# Patient Record
Sex: Male | Born: 1960 | Race: White | Hispanic: No | Marital: Married | Smoking: Never smoker
Health system: Southern US, Community
[De-identification: ages and names within clinical notes are randomized; demographics above are authoritative.]

## PROBLEM LIST (undated history)

## (undated) DIAGNOSIS — E785 Hyperlipidemia, unspecified: Secondary | ICD-10-CM

## (undated) DIAGNOSIS — N182 Chronic kidney disease, stage 2 (mild): Secondary | ICD-10-CM

## (undated) DIAGNOSIS — I10 Essential (primary) hypertension: Secondary | ICD-10-CM

## (undated) HISTORY — DX: Chronic kidney disease, stage 2 (mild): N18.2

## (undated) HISTORY — DX: Essential (primary) hypertension: I10

## (undated) HISTORY — DX: Hyperlipidemia, unspecified: E78.5

---

## 2000-02-07 ENCOUNTER — Encounter: Admission: RE | Admit: 2000-02-07 | Discharge: 2000-02-07 | Payer: Self-pay | Admitting: Family Medicine

## 2000-02-07 ENCOUNTER — Encounter: Payer: Self-pay | Admitting: Family Medicine

## 2010-04-03 ENCOUNTER — Ambulatory Visit: Payer: Self-pay | Admitting: Diagnostic Radiology

## 2010-04-03 ENCOUNTER — Ambulatory Visit (HOSPITAL_BASED_OUTPATIENT_CLINIC_OR_DEPARTMENT_OTHER): Admission: RE | Admit: 2010-04-03 | Discharge: 2010-04-03 | Payer: Self-pay | Admitting: Family Medicine

## 2011-01-01 ENCOUNTER — Emergency Department (HOSPITAL_BASED_OUTPATIENT_CLINIC_OR_DEPARTMENT_OTHER)
Admission: EM | Admit: 2011-01-01 | Discharge: 2011-01-01 | Payer: Self-pay | Source: Home / Self Care | Admitting: Emergency Medicine

## 2014-01-02 ENCOUNTER — Other Ambulatory Visit (HOSPITAL_BASED_OUTPATIENT_CLINIC_OR_DEPARTMENT_OTHER): Payer: Self-pay | Admitting: Family Medicine

## 2014-01-02 DIAGNOSIS — R1013 Epigastric pain: Secondary | ICD-10-CM

## 2014-01-03 ENCOUNTER — Ambulatory Visit (HOSPITAL_BASED_OUTPATIENT_CLINIC_OR_DEPARTMENT_OTHER)
Admission: RE | Admit: 2014-01-03 | Discharge: 2014-01-03 | Disposition: A | Discharge status: Home / Self Care | Payer: 59 | Source: Ambulatory Visit | Attending: Family Medicine | Admitting: Family Medicine

## 2014-01-03 DIAGNOSIS — K829 Disease of gallbladder, unspecified: Secondary | ICD-10-CM | POA: Insufficient documentation

## 2014-01-03 DIAGNOSIS — R1013 Epigastric pain: Secondary | ICD-10-CM

## 2016-06-27 ENCOUNTER — Other Ambulatory Visit (HOSPITAL_BASED_OUTPATIENT_CLINIC_OR_DEPARTMENT_OTHER): Payer: Self-pay | Admitting: Physician Assistant

## 2016-06-27 DIAGNOSIS — M545 Low back pain: Secondary | ICD-10-CM

## 2016-06-28 ENCOUNTER — Ambulatory Visit (HOSPITAL_BASED_OUTPATIENT_CLINIC_OR_DEPARTMENT_OTHER)
Admission: RE | Admit: 2016-06-28 | Discharge: 2016-06-28 | Disposition: A | Discharge status: Home / Self Care | Payer: 59 | Source: Ambulatory Visit | Attending: Physician Assistant | Admitting: Physician Assistant

## 2016-06-28 DIAGNOSIS — M545 Low back pain: Secondary | ICD-10-CM

## 2016-06-28 DIAGNOSIS — M5136 Other intervertebral disc degeneration, lumbar region: Secondary | ICD-10-CM | POA: Insufficient documentation

## 2016-06-28 DIAGNOSIS — M5126 Other intervertebral disc displacement, lumbar region: Secondary | ICD-10-CM | POA: Insufficient documentation

## 2016-06-28 DIAGNOSIS — M4806 Spinal stenosis, lumbar region: Secondary | ICD-10-CM | POA: Diagnosis not present

## 2017-05-20 DIAGNOSIS — Z13220 Encounter for screening for lipoid disorders: Secondary | ICD-10-CM | POA: Diagnosis not present

## 2017-05-20 DIAGNOSIS — Z131 Encounter for screening for diabetes mellitus: Secondary | ICD-10-CM | POA: Diagnosis not present

## 2017-05-20 DIAGNOSIS — L570 Actinic keratosis: Secondary | ICD-10-CM | POA: Diagnosis not present

## 2017-05-20 DIAGNOSIS — Z0000 Encounter for general adult medical examination without abnormal findings: Secondary | ICD-10-CM | POA: Diagnosis not present

## 2017-08-07 DIAGNOSIS — Z1211 Encounter for screening for malignant neoplasm of colon: Secondary | ICD-10-CM | POA: Diagnosis not present

## 2018-05-24 DIAGNOSIS — Z23 Encounter for immunization: Secondary | ICD-10-CM | POA: Diagnosis not present

## 2018-05-24 DIAGNOSIS — Z0000 Encounter for general adult medical examination without abnormal findings: Secondary | ICD-10-CM | POA: Diagnosis not present

## 2018-05-24 DIAGNOSIS — Z13220 Encounter for screening for lipoid disorders: Secondary | ICD-10-CM | POA: Diagnosis not present

## 2018-09-28 DIAGNOSIS — H5712 Ocular pain, left eye: Secondary | ICD-10-CM | POA: Diagnosis not present

## 2018-09-28 DIAGNOSIS — H5789 Other specified disorders of eye and adnexa: Secondary | ICD-10-CM | POA: Diagnosis not present

## 2018-09-29 DIAGNOSIS — H15102 Unspecified episcleritis, left eye: Secondary | ICD-10-CM | POA: Diagnosis not present

## 2018-10-06 DIAGNOSIS — H15102 Unspecified episcleritis, left eye: Secondary | ICD-10-CM | POA: Diagnosis not present

## 2020-12-19 ENCOUNTER — Telehealth (HOSPITAL_COMMUNITY): Payer: Self-pay | Admitting: Nurse Practitioner

## 2020-12-21 NOTE — Telephone Encounter (Signed)
No note to record.  Closing note.

## 2021-09-04 ENCOUNTER — Inpatient Hospital Stay (HOSPITAL_BASED_OUTPATIENT_CLINIC_OR_DEPARTMENT_OTHER)
Admission: EM | Admit: 2021-09-04 | Discharge: 2021-09-06 | DRG: 247 | Disposition: A | Discharge status: Home / Self Care | Payer: 59 | Attending: Cardiovascular Disease | Admitting: Cardiovascular Disease

## 2021-09-04 ENCOUNTER — Other Ambulatory Visit: Payer: Self-pay

## 2021-09-04 ENCOUNTER — Encounter (HOSPITAL_BASED_OUTPATIENT_CLINIC_OR_DEPARTMENT_OTHER): Payer: Self-pay

## 2021-09-04 ENCOUNTER — Emergency Department (HOSPITAL_BASED_OUTPATIENT_CLINIC_OR_DEPARTMENT_OTHER): Payer: 59

## 2021-09-04 ENCOUNTER — Encounter (HOSPITAL_COMMUNITY)
Admission: EM | Disposition: A | Discharge status: Home / Self Care | Payer: Self-pay | Source: Home / Self Care | Attending: Cardiovascular Disease

## 2021-09-04 ENCOUNTER — Inpatient Hospital Stay (HOSPITAL_COMMUNITY): Payer: 59

## 2021-09-04 DIAGNOSIS — I214 Non-ST elevation (NSTEMI) myocardial infarction: Secondary | ICD-10-CM

## 2021-09-04 DIAGNOSIS — Z955 Presence of coronary angioplasty implant and graft: Secondary | ICD-10-CM

## 2021-09-04 DIAGNOSIS — I2121 ST elevation (STEMI) myocardial infarction involving left circumflex coronary artery: Secondary | ICD-10-CM | POA: Diagnosis present

## 2021-09-04 DIAGNOSIS — I213 ST elevation (STEMI) myocardial infarction of unspecified site: Secondary | ICD-10-CM

## 2021-09-04 DIAGNOSIS — I2510 Atherosclerotic heart disease of native coronary artery without angina pectoris: Secondary | ICD-10-CM | POA: Diagnosis present

## 2021-09-04 DIAGNOSIS — Z20822 Contact with and (suspected) exposure to covid-19: Secondary | ICD-10-CM | POA: Diagnosis present

## 2021-09-04 DIAGNOSIS — E785 Hyperlipidemia, unspecified: Secondary | ICD-10-CM | POA: Diagnosis present

## 2021-09-04 HISTORY — DX: Atherosclerotic heart disease of native coronary artery without angina pectoris: I25.10

## 2021-09-04 HISTORY — PX: CORONARY/GRAFT ACUTE MI REVASCULARIZATION: CATH118305

## 2021-09-04 HISTORY — PX: LEFT HEART CATH AND CORONARY ANGIOGRAPHY: CATH118249

## 2021-09-04 LAB — TROPONIN I (HIGH SENSITIVITY)
Troponin I (High Sensitivity): 79 ng/L — ABNORMAL HIGH (ref <18)
Troponin I (High Sensitivity): 75 ng/L — ABNORMAL HIGH (ref <18)

## 2021-09-04 LAB — CBC
HCT: 47.2 % (ref 39.0–52.0)
Hemoglobin: 16 g/dL (ref 13.0–17.0)
MCH: 30.1 pg (ref 26.0–34.0)
MCHC: 33.9 g/dL (ref 30.0–36.0)
MCV: 88.7 fL (ref 80.0–100.0)
Platelets: 277 10*3/uL (ref 150–400)
RBC: 5.32 MIL/uL (ref 4.22–5.81)
RDW: 11.7 % (ref 11.5–15.5)
WBC: 7.3 10*3/uL (ref 4.0–10.5)
nRBC: 0 % (ref 0.0–0.2)

## 2021-09-04 LAB — RESP PANEL BY RT-PCR (FLU A&B, COVID) ARPGX2
Influenza A by PCR: NEGATIVE
Influenza B by PCR: NEGATIVE
SARS Coronavirus 2 by RT PCR: NEGATIVE

## 2021-09-04 LAB — BASIC METABOLIC PANEL
Anion gap: 9 (ref 5–15)
BUN: 25 mg/dL — ABNORMAL HIGH (ref 6–20)
CO2: 25 mmol/L (ref 22–32)
Calcium: 9.7 mg/dL (ref 8.9–10.3)
Chloride: 104 mmol/L (ref 98–111)
Creatinine, Ser: 1.17 mg/dL (ref 0.61–1.24)
GFR, Estimated: >60 mL/min (ref >60)
Glucose, Bld: 98 mg/dL (ref 70–99)
Potassium: 3.8 mmol/L (ref 3.5–5.1)
Sodium: 138 mmol/L (ref 135–145)

## 2021-09-04 LAB — POCT ACTIVATED CLOTTING TIME
Activated Clotting Time: 219 seconds
Activated Clotting Time: 289 seconds

## 2021-09-04 LAB — ECHOCARDIOGRAM COMPLETE
Area-P 1/2: 2.99 cm2
Height: 74 in
S' Lateral: 3.3 cm
Weight: 3558.4 oz

## 2021-09-04 LAB — MRSA NEXT GEN BY PCR, NASAL: MRSA by PCR Next Gen: NOT DETECTED

## 2021-09-04 SURGERY — CORONARY/GRAFT ACUTE MI REVASCULARIZATION
Anesthesia: LOCAL

## 2021-09-04 MED ORDER — CHLORHEXIDINE GLUCONATE CLOTH 2 % EX PADS
6.0000 | MEDICATED_PAD | Freq: Every day | CUTANEOUS | Status: DC
  Administered 2021-09-04: 6 via TOPICAL

## 2021-09-04 MED ORDER — NITROGLYCERIN 1 MG/10 ML FOR IR/CATH LAB
INTRA_ARTERIAL | Status: AC
  Filled 2021-09-04: qty 10

## 2021-09-04 MED ORDER — TICAGRELOR 90 MG PO TABS
ORAL_TABLET | ORAL | Status: AC
  Filled 2021-09-04: qty 2

## 2021-09-04 MED ORDER — NITROGLYCERIN 0.4 MG SL SUBL
0.4000 mg | SUBLINGUAL_TABLET | SUBLINGUAL | Status: DC | PRN
  Administered 2021-09-04: 0.4 mg via SUBLINGUAL
  Filled 2021-09-04: qty 1

## 2021-09-04 MED ORDER — HEPARIN SODIUM (PORCINE) 1000 UNIT/ML IJ SOLN
INTRAMUSCULAR | Status: DC | PRN
  Administered 2021-09-04 (×2): 5000 [IU] via INTRAVENOUS

## 2021-09-04 MED ORDER — HYDRALAZINE HCL 20 MG/ML IJ SOLN
10.0000 mg | INTRAMUSCULAR | Status: AC | PRN
Start: 2021-09-04 — End: 2021-09-04

## 2021-09-04 MED ORDER — NITROGLYCERIN 0.4 MG SL SUBL: 0.4000 mg | SUBLINGUAL_TABLET | SUBLINGUAL | Status: DC | PRN

## 2021-09-04 MED ORDER — ASPIRIN EC 81 MG PO TBEC
81.0000 mg | DELAYED_RELEASE_TABLET | Freq: Every day | ORAL | Status: DC
  Administered 2021-09-05 – 2021-09-06 (×2): 81 mg via ORAL
  Filled 2021-09-04 (×2): qty 1

## 2021-09-04 MED ORDER — ONDANSETRON HCL 4 MG/2ML IJ SOLN: 4.0000 mg | Freq: Four times a day (QID) | INTRAMUSCULAR | Status: DC | PRN

## 2021-09-04 MED ORDER — HEPARIN (PORCINE) IN NACL 1000-0.9 UT/500ML-% IV SOLN
INTRAVENOUS | Status: AC
  Filled 2021-09-04: qty 500

## 2021-09-04 MED ORDER — ACETAMINOPHEN 325 MG PO TABS: 650.0000 mg | ORAL_TABLET | ORAL | Status: DC | PRN

## 2021-09-04 MED ORDER — ASPIRIN 81 MG PO CHEW
324.0000 mg | CHEWABLE_TABLET | Freq: Once | ORAL | Status: AC
  Administered 2021-09-04: 324 mg via ORAL
  Filled 2021-09-04: qty 4

## 2021-09-04 MED ORDER — TICAGRELOR 90 MG PO TABS
ORAL_TABLET | ORAL | Status: DC | PRN
  Administered 2021-09-04: 180 mg via ORAL

## 2021-09-04 MED ORDER — FENTANYL CITRATE (PF) 100 MCG/2ML IJ SOLN
INTRAMUSCULAR | Status: AC
  Filled 2021-09-04: qty 2

## 2021-09-04 MED ORDER — SODIUM CHLORIDE 0.9% FLUSH
3.0000 mL | Freq: Two times a day (BID) | INTRAVENOUS | Status: DC
  Administered 2021-09-04 – 2021-09-06 (×4): 3 mL via INTRAVENOUS

## 2021-09-04 MED ORDER — FENTANYL CITRATE (PF) 100 MCG/2ML IJ SOLN
INTRAMUSCULAR | Status: DC | PRN
  Administered 2021-09-04: 25 ug via INTRAVENOUS
  Administered 2021-09-04: 50 ug via INTRAVENOUS

## 2021-09-04 MED ORDER — VERAPAMIL HCL 2.5 MG/ML IV SOLN
INTRAVENOUS | Status: DC | PRN
  Administered 2021-09-04: 10 mL via INTRA_ARTERIAL

## 2021-09-04 MED ORDER — TICAGRELOR 90 MG PO TABS
90.0000 mg | ORAL_TABLET | Freq: Two times a day (BID) | ORAL | Status: DC
  Administered 2021-09-04 – 2021-09-06 (×4): 90 mg via ORAL
  Filled 2021-09-04 (×4): qty 1

## 2021-09-04 MED ORDER — HEPARIN SODIUM (PORCINE) 1000 UNIT/ML IJ SOLN
INTRAMUSCULAR | Status: AC
  Filled 2021-09-04: qty 1

## 2021-09-04 MED ORDER — SODIUM CHLORIDE 0.9 % WEIGHT BASED INFUSION
1.0000 mL/kg/h | INTRAVENOUS | Status: AC
  Administered 2021-09-04: 1 mL/kg/h via INTRAVENOUS

## 2021-09-04 MED ORDER — NITROGLYCERIN 1 MG/10 ML FOR IR/CATH LAB
INTRA_ARTERIAL | Status: DC | PRN
  Administered 2021-09-04: 200 ug via INTRACORONARY
  Administered 2021-09-04: 150 ug via INTRACORONARY
  Administered 2021-09-04: 100 ug via INTRACORONARY

## 2021-09-04 MED ORDER — HEPARIN SODIUM (PORCINE) 5000 UNIT/ML IJ SOLN
4000.0000 [IU] | Freq: Once | INTRAMUSCULAR | Status: AC
  Administered 2021-09-04: 4000 [IU] via INTRAVENOUS
  Filled 2021-09-04: qty 1

## 2021-09-04 MED ORDER — HEPARIN (PORCINE) IN NACL 1000-0.9 UT/500ML-% IV SOLN
INTRAVENOUS | Status: DC | PRN
  Administered 2021-09-04 (×2): 500 mL

## 2021-09-04 MED ORDER — LIDOCAINE HCL (PF) 1 % IJ SOLN
INTRAMUSCULAR | Status: DC | PRN
  Administered 2021-09-04: 2 mL

## 2021-09-04 MED ORDER — VERAPAMIL HCL 2.5 MG/ML IV SOLN
INTRAVENOUS | Status: AC
  Filled 2021-09-04: qty 2

## 2021-09-04 MED ORDER — SODIUM CHLORIDE 0.9 % IV SOLN: 250.0000 mL | INTRAVENOUS | Status: DC | PRN

## 2021-09-04 MED ORDER — ATORVASTATIN CALCIUM 80 MG PO TABS
80.0000 mg | ORAL_TABLET | Freq: Every day | ORAL | Status: DC
  Administered 2021-09-04 – 2021-09-06 (×3): 80 mg via ORAL
  Filled 2021-09-04 (×3): qty 1

## 2021-09-04 MED ORDER — SODIUM CHLORIDE 0.9% FLUSH: 3.0000 mL | INTRAVENOUS | Status: DC | PRN

## 2021-09-04 MED ORDER — HEPARIN (PORCINE) 25000 UT/250ML-% IV SOLN: 1200.0000 [IU]/h | INTRAVENOUS | Status: DC

## 2021-09-04 MED ORDER — MIDAZOLAM HCL 2 MG/2ML IJ SOLN
INTRAMUSCULAR | Status: AC
  Filled 2021-09-04: qty 2

## 2021-09-04 MED ORDER — LIDOCAINE HCL (PF) 1 % IJ SOLN
INTRAMUSCULAR | Status: AC
Start: 2021-09-04 — End: ?
  Filled 2021-09-04: qty 30

## 2021-09-04 MED ORDER — MIDAZOLAM HCL 2 MG/2ML IJ SOLN
INTRAMUSCULAR | Status: DC | PRN
  Administered 2021-09-04 (×2): 2 mg via INTRAVENOUS

## 2021-09-04 MED ORDER — LABETALOL HCL 5 MG/ML IV SOLN: 10.0000 mg | INTRAVENOUS | Status: AC | PRN

## 2021-09-04 MED ORDER — HEPARIN BOLUS VIA INFUSION: 4000.0000 [IU] | Freq: Once | INTRAVENOUS | Status: DC

## 2021-09-04 MED ORDER — SODIUM CHLORIDE 0.9 % IV SOLN: Freq: Once | INTRAVENOUS | Status: AC

## 2021-09-04 MED ORDER — IOHEXOL 350 MG/ML SOLN
INTRAVENOUS | Status: DC | PRN
  Administered 2021-09-04: 175 mL

## 2021-09-04 SURGICAL SUPPLY — 20 items
BALLN SAPPHIRE 2.0X12 (BALLOONS) ×2
BALLN SAPPHIRE ~~LOC~~ 3.0X12 (BALLOONS) ×1 IMPLANT
BALLN SAPPHIRE ~~LOC~~ 3.75X15 (BALLOONS) ×1 IMPLANT
BALLOON SAPPHIRE 2.0X12 (BALLOONS) IMPLANT
CATH 5FR JL3.5 JR4 ANG PIG MP (CATHETERS) ×1 IMPLANT
CATH LAUNCHER 6FR EBU3.5 (CATHETERS) ×1 IMPLANT
DEVICE RAD COMP TR BAND LRG (VASCULAR PRODUCTS) ×1 IMPLANT
GLIDESHEATH SLEND SS 6F .021 (SHEATH) ×1 IMPLANT
GUIDEWIRE INQWIRE 1.5J.035X260 (WIRE) IMPLANT
INQWIRE 1.5J .035X260CM (WIRE) ×2
KIT ENCORE 26 ADVANTAGE (KITS) ×1 IMPLANT
KIT HEART LEFT (KITS) ×2 IMPLANT
PACK CARDIAC CATHETERIZATION (CUSTOM PROCEDURE TRAY) ×2 IMPLANT
STENT ONYX FRONTIER 2.5X15 (Permanent Stent) ×1 IMPLANT
STENT ONYX FRONTIER 2.75X15 (Permanent Stent) ×1 IMPLANT
STENT ONYX FRONTIER 3.5X26 (Permanent Stent) ×1 IMPLANT
SYR MEDRAD MARK 7 150ML (SYRINGE) ×2 IMPLANT
TRANSDUCER W/STOPCOCK (MISCELLANEOUS) ×2 IMPLANT
TUBING CIL FLEX 10 FLL-RA (TUBING) ×2 IMPLANT
WIRE COUGAR XT STRL 190CM (WIRE) ×1 IMPLANT

## 2021-09-04 NOTE — Progress Notes (Signed)
ANTICOAGULATION CONSULT NOTE - Initial Consult  Pharmacy Consult for heparin Indication: chest pain/ACS  No Known Allergies  Patient Measurements: Height: 6\' 2"  (188 cm) Weight: 100.9 kg (222 lb 6.4 oz) IBW/kg (Calculated) : 82.2 Heparin Dosing Weight: 95kg  Vital Signs: Temp: 97.6 F (36.4 C) (09/14 1014) Temp Source: Oral (09/14 1014) BP: 136/88 (09/14 1245) Pulse Rate: 76 (09/14 1245)  Labs: Recent Labs    09/04/21 1020  HGB 16.0  HCT 47.2  PLT 277  CREATININE 1.17  TROPONINIHS 79*    Estimated Creatinine Clearance: 85.2 mL/min (by C-G formula based on SCr of 1.17 mg/dL).   Medical History: History reviewed. No pertinent past medical history.   Assessment: 60 YOM presenting with CP, elevated troponin, he is not on anticoagulation PTA  Goal of Therapy:  Heparin level 0.3-0.7 units/ml Monitor platelets by anticoagulation protocol: Yes   Plan:  Heparin 4000 units IV x 1, and gtt at 1200 units/hr F/u 6 hour heparin level F/u cards eval and recs  09/06/21, PharmD Clinical Pharmacist ED Pharmacist Phone # [phone] 09/04/2021 12:49 PM

## 2021-09-04 NOTE — ED Notes (Signed)
ED Provider at bedside. 

## 2021-09-04 NOTE — Interval H&P Note (Signed)
Cath Lab Visit (complete for each Cath Lab visit)  Clinical Evaluation Leading to the Procedure:   ACS: Yes.    Non-ACS:    Anginal Classification: CCS IV  Anti-ischemic medical therapy: No Therapy  Non-Invasive Test Results: No non-invasive testing performed  Prior CABG: No previous CABG      History and Physical Interval Note:  09/04/2021 1:31 PM  Donald Jimenez  has presented today for surgery, with the diagnosis of STEMI.  The various methods of treatment have been discussed with the patient and family. After consideration of risks, benefits and other options for treatment, the patient has consented to  Procedure(s): Coronary/Graft Acute MI Revascularization (N/A) LEFT HEART CATH AND CORONARY ANGIOGRAPHY (N/A) as a surgical intervention.  The patient's history has been reviewed, patient examined, no change in status, stable for surgery.  I have reviewed the patient's chart and labs.  Questions were answered to the patient's satisfaction.     Donald Jimenez

## 2021-09-04 NOTE — ED Triage Notes (Signed)
Pt arrives ambulatory to ED with c/o central CP states "it feels like something is stuck" denies any trouble eating or swallowing. No radiation, denies any associated symptoms.

## 2021-09-04 NOTE — Progress Notes (Signed)
  Echocardiogram 2D Echocardiogram has been performed.  Donald Jimenez 09/04/2021, 4:33 PM

## 2021-09-04 NOTE — H&P (Signed)
Cardiology Admission History and Physical:   Patient ID: Donald Jimenez MRN: [id number]; DOB: 1961-07-17   Admission date: 09/04/2021  PCP:  [facility] Providers Cardiologist:  None        Chief Complaint:  Chest pain  Patient Profile:   Donald Jimenez is a 60 y.o. male with no significant past medical history who is being seen 09/04/2021 for the evaluation of chest pain.  History of Present Illness:   Donald Jimenez is transferred from [facility] emergency department as a code STEMI.  The patient is chest pain-free on arrival.  He has had stuttering chest pain over the last 48 hours with 15 to 20-minute episodes of substernal chest pressure multiple times yesterday and again this morning.  This morning's episode prompted him to seek emergency room evaluation.  The patient has had no chest pain since arrival at the [facility] emergency room.  He has no past history of similar symptoms.  There is no family history of coronary artery disease, CABG, or MI.  The patient is a lifelong non-smoker.  He has no personal history of hypertension, diabetes, or hyperlipidemia.  Because of dynamic EKG changes and elevated troponin, he is brought emergently for cardiac catheterization and possible PCI.   History reviewed. No pertinent past medical history.  History reviewed. No pertinent surgical history.   Medications Prior to Admission: Prior to Admission medications   Not on File     Allergies:   No Known Allergies  Social History:   Social History   Socioeconomic History   Marital status: Married    Spouse name: Not on file   Number of children: Not on file   Years of education: Not on file   Highest education level: Not on file  Occupational History   Not on file  Tobacco Use   Smoking status: Never   Smokeless tobacco: Never  Vaping Use   Vaping Use: Never used  Substance and Sexual Activity   Alcohol use: Yes     Comment: few tine time s a year   Drug use: Never   Sexual activity: Not on file  Other Topics Concern   Not on file  Social History Narrative   Not on file   Social Determinants of Health   Financial Resource Strain: Not on file  Food Insecurity: Not on file  Transportation Needs: Not on file  Physical Activity: Not on file  Stress: Not on file  Social Connections: Not on file  Intimate Partner Violence: Not on file    Family History:   The patient's family history is negative for Heart attack and Heart disease.    ROS:  Please see the history of present illness.  All other ROS reviewed and negative.     Physical Exam/Data:   Vitals:   09/04/21 1211 09/04/21 1230 09/04/21 1245 09/04/21 1326  BP:   136/88   Pulse:  75 76   Resp:  13 16   Temp:      TempSrc:      SpO2:  100% 93% 99%  Weight: 100.9 kg     Height:       No intake or output data in the 24 hours ending 09/04/21 1329 Last 3 Weights 09/04/2021 09/04/2021  Weight (lbs) 222 lb 6.4 oz 210 lb  Weight (kg) 100.88 kg 95.255 kg     Body mass index is 28.55 kg/m.  General:  Well nourished, well developed, in no acute distress HEENT: normal Lymph: no adenopathy Neck: no JVD Endocrine:  No thryomegaly Vascular: No carotid bruits; FA pulses 2+ bilaterally  Cardiac:  normal S1, S2; RRR; no murmur  Lungs:  clear to auscultation bilaterally, no wheezing, rhonchi or rales  Abd: soft, nontender, no hepatomegaly  Ext: no edema Musculoskeletal:  No deformities, BUE and BLE strength normal and equal Skin: warm and dry  Neuro:  CNs 2-12 intact, no focal abnormalities noted Psych:  Normal affect    EKG:  The ECG that was done today was personally reviewed and demonstrates normal sinus rhythm, ST/T wave changes consider acute posterior infarction versus anterior ischemia.  Relevant CV Studies: Pending  Laboratory Data:  High Sensitivity Troponin:   Recent Labs  Lab 09/04/21 1020 09/04/21 1215   TROPONINIHS 79* 75*      Chemistry Recent Labs  Lab 09/04/21 1020  NA 138  K 3.8  CL 104  CO2 25  GLUCOSE 98  BUN 25*  CREATININE 1.17  CALCIUM 9.7  GFRNONAA >60  ANIONGAP 9    No results for input(s): PROT, ALBUMIN, AST, ALT, ALKPHOS, BILITOT in the last 168 hours. Hematology Recent Labs  Lab 09/04/21 1020  WBC 7.3  RBC 5.32  HGB 16.0  HCT 47.2  MCV 88.7  MCH 30.1  MCHC 33.9  RDW 11.7  PLT 277   BNPNo results for input(s): BNP, PROBNP in the last 168 hours.  DDimer No results for input(s): DDIMER in the last 168 hours.   Radiology/Studies:  DG Chest 2 View  Result Date: 09/04/2021 CLINICAL DATA:  Chest pain EXAM: CHEST - 2 VIEW COMPARISON:  None. FINDINGS: The heart size and mediastinal contours are within normal limits. Both lungs are clear. The visualized skeletal structures are unremarkable. IMPRESSION: Normal study. Electronically Signed   By: Charlett Nose M.D.   On: 09/04/2021 10:48     Assessment and Plan:   Non-ST elevation MI: Patient with dynamic EKG changes, some concern for posterior infarct.  Patient is received IV heparin and aspirin per protocol.  He is transferred emergently for cardiac catheterization and possible PCI. I have reviewed the risks, indications, and alternatives to cardiac catheterization, possible angioplasty, and stenting with the patient. Risks include but are not limited to bleeding, infection, vascular injury, stroke, myocardial infection, arrhythmia, kidney injury, radiation-related injury in the case of prolonged fluoroscopy use, emergency cardiac surgery, and death. The patient understands the risks of serious complication is 1-2 in 1000 with diagnostic cardiac cath and 1-2% or less with angioplasty/stenting.  Further plans/disposition pending the patient's cardiac catheterization results.  Anticipate initiation of high intensity statin drug, dual antiplatelet therapy, etc.  Will assess LV function.  Risk Assessment/Risk Scores:     TIMI Risk Score for Unstable Angina or Non-ST Elevation MI:   The patient's TIMI risk score is 4, which indicates a 20% risk of all cause mortality, new or recurrent myocardial infarction or need for urgent revascularization in the next 14 days.       Severity of Illness: The appropriate patient status for this patient is INPATIENT. Inpatient status is judged to be reasonable and necessary in order to provide the required intensity of service to ensure the patient's safety. The patient's presenting symptoms, physical exam findings, and initial radiographic and laboratory data in the context of their chronic comorbidities is felt to place them at high risk for further clinical deterioration. Furthermore, it is not anticipated that the patient will be  medically stable for discharge from the hospital within 2 midnights of admission.    * I certify that at the point of admission it is my clinical judgment that the patient will require inpatient hospital care spanning beyond 2 midnights from the point of admission due to high intensity of service, high risk for further deterioration and high frequency of surveillance required.*   For questions or updates, please contact [facility] Please consult [email or web address] for contact info under     Signed, Tonny Bollman, MD  09/04/2021 1:29 PM

## 2021-09-04 NOTE — ED Provider Notes (Signed)
[facility] EMERGENCY DEPARTMENT Provider Note   CSN: [id number] Arrival date & time: 09/04/21  1003     History Chief Complaint  Patient presents with   Chest Pain    Donald Jimenez is a 60 y.o. male.  Patient is a 60 year old male who presents with chest pain.  He has no prior medical history.  He said he had some pain in his central chest yesterday it lasted most of the day.  It went away through the night after he took some ibuprofen.  This morning he was at work and she was walking out of a gas station with some coffee and started having the pain again.  Describes it as a heaviness to his central chest and feels like something is in his throat.  There is no shortness of breath.  No nausea or vomiting.  No diaphoresis.  No history of heart problems in the past.  He is not on any medications.  He does not take aspirin.      History reviewed. No pertinent past medical history.  There are no problems to display for this patient.   History reviewed. No pertinent surgical history.     No family history on file.  Social History   Tobacco Use   Smoking status: Never   Smokeless tobacco: Never  Vaping Use   Vaping Use: Never used  Substance Use Topics   Alcohol use: Yes    Comment: few tine time s a year   Drug use: Never    Home Medications Prior to Admission medications   Not on File    Allergies    Patient has no known allergies.  Review of Systems   Review of Systems  Constitutional:  Negative for chills, diaphoresis, fatigue and fever.  HENT:  Negative for congestion, rhinorrhea and sneezing.   Eyes: Negative.   Respiratory:  Positive for chest tightness. Negative for cough and shortness of breath.   Cardiovascular:  Negative for chest pain and leg swelling.  Gastrointestinal:  Negative for abdominal pain, blood in stool, diarrhea, nausea and vomiting.  Genitourinary:  Negative for difficulty urinating, flank pain, frequency and hematuria.   Musculoskeletal:  Negative for arthralgias and back pain.  Skin:  Negative for rash.  Neurological:  Negative for dizziness, speech difficulty, weakness, numbness and headaches.   Physical Exam Updated Vital Signs BP (!) 163/101   Pulse 65   Temp 97.6 F (36.4 C) (Oral)   Resp 13   Ht 6\' 2"  (1.88 m)   Wt 100.9 kg   SpO2 98%   BMI 28.55 kg/m   Physical Exam Constitutional:      Appearance: He is well-developed.  HENT:     Head: Normocephalic and atraumatic.  Eyes:     Pupils: Pupils are equal, round, and reactive to light.  Cardiovascular:     Rate and Rhythm: Normal rate and regular rhythm.     Heart sounds: Normal heart sounds.  Pulmonary:     Effort: Pulmonary effort is normal. No respiratory distress.     Breath sounds: Normal breath sounds. No wheezing or rales.  Chest:     Chest wall: No tenderness.  Abdominal:     General: Bowel sounds are normal.     Palpations: Abdomen is soft.     Tenderness: There is no abdominal tenderness. There is no guarding or rebound.  Musculoskeletal:        General: Normal range of motion.     Cervical back: Normal  range of motion and neck supple.     Comments: No edema or calf tenderness  Lymphadenopathy:     Cervical: No cervical adenopathy.  Skin:    General: Skin is warm and dry.     Findings: No rash.  Neurological:     Mental Status: He is alert and oriented to person, place, and time.    ED Results / Procedures / Treatments   Labs (all labs ordered are listed, but only abnormal results are displayed) Labs Reviewed  BASIC METABOLIC PANEL - Abnormal; Notable for the following components:      Result Value   BUN 25 (*)    All other components within normal limits  TROPONIN I (HIGH SENSITIVITY) - Abnormal; Notable for the following components:   Troponin I (High Sensitivity) 79 (*)    All other components within normal limits  CBC  TROPONIN I (HIGH SENSITIVITY)    EKG EKG Interpretation  Date/Time:  Wednesday  September 04 2021 10:11:25 EDT Ventricular Rate:  67 PR Interval:  156 QRS Duration: 78 QT Interval:  358 QTC Calculation: 378 R Axis:   43 Text Interpretation: Normal sinus rhythm Nonspecific ST abnormality Abnormal ECG No old tracing to compare Confirmed by Rolan Bucco [phone]) on 09/04/2021 12:00:21 PM  Radiology DG Chest 2 View  Result Date: 09/04/2021 CLINICAL DATA:  Chest pain EXAM: CHEST - 2 VIEW COMPARISON:  None. FINDINGS: The heart size and mediastinal contours are within normal limits. Both lungs are clear. The visualized skeletal structures are unremarkable. IMPRESSION: Normal study. Electronically Signed   By: Charlett Nose M.D.   On: 09/04/2021 10:48    Procedures Procedures   Medications Ordered in ED Medications  aspirin chewable tablet 324 mg (has no administration in time range)  heparin bolus via infusion 4,000 Units (has no administration in time range)  nitroGLYCERIN (NITROSTAT) SL tablet 0.4 mg (has no administration in time range)    ED Course  I have reviewed the triage vital signs and the nursing notes.  Pertinent labs & imaging results that were available during my care of the patient were reviewed by me and considered in my medical decision making (see chart for details).    MDM Rules/Calculators/A&P                           Patient is a 60 year old male who presents with chest discomfort.  No associated symptoms.  His first EKG showed some slight ST depression anteriorly.  His troponin came back positive at 79.  A repeat EKG was performed which showed some slight increase in the ST depression.  No elevation was noted.  I spoke with Dr. Excell Seltzer with cardiology who recommends to go ahead and activate the Cath Lab given the EKG changes in conjunction with unstable angina.  He does have ongoing chest pain.  We will start nitroglycerin, aspirin and heparin.  CRITICAL CARE Performed by: Rolan Bucco Total critical care time: 60 minutes Critical care time  was exclusive of separately billable procedures and treating other patients. Critical care was necessary to treat or prevent imminent or life-threatening deterioration. Critical care was time spent personally by me on the following activities: development of treatment plan with patient and/or surrogate as well as nursing, discussions with consultants, evaluation of patient's response to treatment, examination of patient, obtaining history from patient or surrogate, ordering and performing treatments and interventions, ordering and review of laboratory studies, ordering and review of radiographic studies,  pulse oximetry and re-evaluation of patient's condition.  Final Clinical Impression(s) / ED Diagnoses Final diagnoses:  NSTEMI (non-ST elevated myocardial infarction) [facility])    Rx / DC Orders ED Discharge Orders     None        Rolan Bucco, MD 09/04/21 1240

## 2021-09-05 ENCOUNTER — Other Ambulatory Visit (HOSPITAL_COMMUNITY): Payer: Self-pay

## 2021-09-05 ENCOUNTER — Encounter (HOSPITAL_COMMUNITY): Payer: Self-pay | Admitting: Cardiovascular Disease

## 2021-09-05 DIAGNOSIS — I214 Non-ST elevation (NSTEMI) myocardial infarction: Secondary | ICD-10-CM

## 2021-09-05 LAB — BASIC METABOLIC PANEL
Anion gap: 5 (ref 5–15)
BUN: 18 mg/dL (ref 6–20)
CO2: 27 mmol/L (ref 22–32)
Calcium: 9.1 mg/dL (ref 8.9–10.3)
Chloride: 105 mmol/L (ref 98–111)
Creatinine, Ser: 1.31 mg/dL — ABNORMAL HIGH (ref 0.61–1.24)
GFR, Estimated: >60 mL/min (ref >60)
Glucose, Bld: 90 mg/dL (ref 70–99)
Potassium: 4.2 mmol/L (ref 3.5–5.1)
Sodium: 137 mmol/L (ref 135–145)

## 2021-09-05 LAB — CBC
HCT: 41.6 % (ref 39.0–52.0)
Hemoglobin: 14.2 g/dL (ref 13.0–17.0)
MCH: 30.3 pg (ref 26.0–34.0)
MCHC: 34.1 g/dL (ref 30.0–36.0)
MCV: 88.9 fL (ref 80.0–100.0)
Platelets: 236 10*3/uL (ref 150–400)
RBC: 4.68 MIL/uL (ref 4.22–5.81)
RDW: 11.7 % (ref 11.5–15.5)
WBC: 11.1 10*3/uL — ABNORMAL HIGH (ref 4.0–10.5)
nRBC: 0 % (ref 0.0–0.2)

## 2021-09-05 LAB — LIPID PANEL
Cholesterol: 191 mg/dL (ref 0–200)
HDL: 41 mg/dL (ref >40)
LDL Cholesterol: 120 mg/dL — ABNORMAL HIGH (ref 0–99)
Total CHOL/HDL Ratio: 4.7 RATIO
Triglycerides: 148 mg/dL (ref <150)
VLDL: 30 mg/dL (ref 0–40)

## 2021-09-05 LAB — HIV ANTIBODY (ROUTINE TESTING W REFLEX): HIV Screen 4th Generation wRfx: NONREACTIVE

## 2021-09-05 MED ORDER — METOPROLOL TARTRATE 12.5 MG HALF TABLET
12.5000 mg | ORAL_TABLET | Freq: Two times a day (BID) | ORAL | Status: DC
  Administered 2021-09-05 – 2021-09-06 (×3): 12.5 mg via ORAL
  Filled 2021-09-05 (×3): qty 1

## 2021-09-05 MED ORDER — ENOXAPARIN SODIUM 40 MG/0.4ML IJ SOSY
40.0000 mg | PREFILLED_SYRINGE | INTRAMUSCULAR | Status: DC
  Administered 2021-09-05 – 2021-09-06 (×2): 40 mg via SUBCUTANEOUS
  Filled 2021-09-05 (×2): qty 0.4

## 2021-09-05 NOTE — Care Management (Addendum)
09-05-21 1316 Benefits check submitted for Brilinta. Case Manager will follow for cost. Patient will need a co pay card for home.

## 2021-09-05 NOTE — Plan of Care (Signed)
  Problem: Education: Goal: Knowledge of General Education information will improve Description: Including pain rating scale, medication(s)/side effects and non-pharmacologic comfort measures Outcome: Progressing   Problem: Clinical Measurements: Goal: Diagnostic test results will improve Outcome: Progressing   Problem: Nutrition: Goal: Adequate nutrition will be maintained Outcome: Progressing   Problem: Coping: Goal: Level of anxiety will decrease Outcome: Progressing   Problem: Elimination: Goal: Will not experience complications related to urinary retention Outcome: Progressing   Problem: Pain Managment: Goal: General experience of comfort will improve Outcome: Progressing

## 2021-09-05 NOTE — TOC Benefit Eligibility Note (Signed)
Transition of Care [facility]) Benefit Eligibility Note    Patient Details  Name: Donald Jimenez MRN: [id number] Date of Birth: Dec 26, 1960   Medication/Dose: BRILINTA  90 MG BID  Covered?: Yes  Tier: 3 Drug  Prescription Coverage Preferred Pharmacy: [facility]  Spoke with Person/Company/Phone Number:: IEESHA  @ [employer] E[employer] # [phone]  Co-Pay: $ 60.00  Prior Approval: No  Deductible: Met (OUT-OF-POCKET:UNMET)       Memory Argue Phone Number: 09/05/2021, 1:55 PM

## 2021-09-05 NOTE — Progress Notes (Signed)
Paged cardiology and advised that heart rate was dropping down to around 56 but not sustaining. On call cardiology spoke with pt due to pt having concern about heart rate.

## 2021-09-05 NOTE — Progress Notes (Signed)
Progress Note  Patient Name: Donald Jimenez Date of Encounter: 09/05/2021  [facility] Cardiologist: Excell Seltzer  Subjective   Postop day 1 circumflex STEMI status post multivessel intervention.  The patient is asymptomatic.  Inpatient Medications    Scheduled Meds:  aspirin EC  81 mg Oral Daily   atorvastatin  80 mg Oral Daily   Chlorhexidine Gluconate Cloth  6 each Topical Daily   sodium chloride flush  3 mL Intravenous Q12H   ticagrelor  90 mg Oral BID   Continuous Infusions:  sodium chloride     PRN Meds: sodium chloride, acetaminophen, nitroGLYCERIN, ondansetron (ZOFRAN) IV, sodium chloride flush   Vital Signs    Vitals:   09/05/21 0400 09/05/21 0500 09/05/21 0600 09/05/21 0700  BP: (!) 128/91 140/88 133/87 (!) 147/91  Pulse: (!) 59 63 66 65  Resp: 13 13 13 15   Temp:      TempSrc:      SpO2: 98% 99% 96% 96%  Weight:      Height:        Intake/Output Summary (Last 24 hours) at 09/05/2021 0809 Last data filed at 09/05/2021 0500 Gross per 24 hour  Intake 739.57 ml  Output 2075 ml  Net -1335.43 ml   Last 3 Weights 09/04/2021 09/04/2021  Weight (lbs) 222 lb 6.4 oz 210 lb  Weight (kg) 100.88 kg 95.255 kg      Telemetry    Sinus rhythm with occasional PVCs- Personally Reviewed  ECG    Normal sinus rhythm at 63 without ST or T wave changes.- Personally Reviewed  Physical Exam   GEN: No acute distress.   Neck: No JVD Cardiac: RRR, no murmurs, rubs, or gallops.  Respiratory: Clear to auscultation bilaterally. GI: Soft, nontender, non-distended  MS: No edema; No deformity. Neuro:  Nonfocal  Psych: Normal affect   Labs    High Sensitivity Troponin:   Recent Labs  Lab 09/04/21 1020 09/04/21 1215  TROPONINIHS 79* 75*      Chemistry Recent Labs  Lab 09/04/21 1020 09/05/21 0240  NA 138 137  K 3.8 4.2  CL 104 105  CO2 25 27  GLUCOSE 98 90  BUN 25* 18  CREATININE 1.17 1.31*  CALCIUM 9.7 9.1  GFRNONAA >60 >60  ANIONGAP 9 5      Hematology Recent Labs  Lab 09/04/21 1020 09/05/21 0240  WBC 7.3 11.1*  RBC 5.32 4.68  HGB 16.0 14.2  HCT 47.2 41.6  MCV 88.7 88.9  MCH 30.1 30.3  MCHC 33.9 34.1  RDW 11.7 11.7  PLT 277 236    BNPNo results for input(s): BNP, PROBNP in the last 168 hours.   DDimer No results for input(s): DDIMER in the last 168 hours.   Radiology    DG Chest 2 View  Result Date: 09/04/2021 CLINICAL DATA:  Chest pain EXAM: CHEST - 2 VIEW COMPARISON:  None. FINDINGS: The heart size and mediastinal contours are within normal limits. Both lungs are clear. The visualized skeletal structures are unremarkable. IMPRESSION: Normal study. Electronically Signed   By: 09/06/2021 M.D.   On: 09/04/2021 10:48   CARDIAC CATHETERIZATION  Result Date: 09/04/2021   Prox LAD to Mid LAD lesion is 75% stenosed.   Post intervention, there is a 0% residual stenosis. 1.  Total occlusion of the mid circumflex, treated with PCI using a 2.5 x 15 mm Onyx frontier DES 2.  Severe stenosis of the proximal LAD, treated with PCI using a 3.5 x 26 mm Onyx frontier  DES 3.  Severe stenosis of the first diagonal branch of the LAD, treated with PCI using a 2.75 x 15 mm Onyx frontier DES 4.  Patent, dominant RCA with mild diffuse plaquing and no significant stenosis 5.  Normal LV systolic function with LVEF estimated at 55 to 65% Recommendations: 2D echo, aggressive medical therapy with LDL goal less than 70 mg/dL, dual antiplatelet therapy with aspirin and Brilinta at least 12 months, as long as no complications arise the patient should be eligible for hospital discharge tomorrow.   ECHOCARDIOGRAM COMPLETE  Result Date: 09/04/2021    ECHOCARDIOGRAM REPORT   [name] Date of Exam: 09/04/2021 Medical Rec #:  [id number]    Height:       74.0 in Accession #:    [id number]   Weight:       222.4 lb Date of Birth:  Oct 16, 1961    BSA:          2.274 m Patient Age:    60 years     BP:           119/83 mmHg Patient Gender: M             HR:           62 bpm. Exam Location:  Inpatient Procedure: 2D Echo Indications:    NSTEMI  History:        Patient has no prior history of Echocardiogram examinations.  Sonographer:    Delcie Roch RDCS Referring Phys: [phone] MICHAEL COOPER IMPRESSIONS  1. Left ventricular ejection fraction, by estimation, is 55 to 60%. The left ventricle has normal function. The left ventricle has no regional wall motion abnormalities. Left ventricular diastolic parameters are consistent with Grade I diastolic dysfunction (impaired relaxation).  2. Right ventricular systolic function is normal. The right ventricular size is normal. There is normal pulmonary artery systolic pressure.  3. The mitral valve is normal in structure. Trivial mitral valve regurgitation. No evidence of mitral stenosis.  4. The aortic valve is tricuspid. Aortic valve regurgitation is trivial. No aortic stenosis is present.  5. The inferior vena cava is normal in size with greater than 50% respiratory variability, suggesting right atrial pressure of 3 mmHg. FINDINGS  Left Ventricle: Left ventricular ejection fraction, by estimation, is 55 to 60%. The left ventricle has normal function. The left ventricle has no regional wall motion abnormalities. The left ventricular internal cavity size was normal in size. There is  no left ventricular hypertrophy. Left ventricular diastolic parameters are consistent with Grade I diastolic dysfunction (impaired relaxation). Normal left ventricular filling pressure. Right Ventricle: The right ventricular size is normal. No increase in right ventricular wall thickness. Right ventricular systolic function is normal. There is normal pulmonary artery systolic pressure. The tricuspid regurgitant velocity is 1.87 m/s, and  with an assumed right atrial pressure of 3 mmHg, the estimated right ventricular systolic pressure is 17.0 mmHg. Left Atrium: Left atrial size was normal in size. Right Atrium: Right atrial size was  normal in size. Pericardium: There is no evidence of pericardial effusion. Mitral Valve: The mitral valve is normal in structure. Trivial mitral valve regurgitation. No evidence of mitral valve stenosis. Tricuspid Valve: The tricuspid valve is normal in structure. Tricuspid valve regurgitation is trivial. No evidence of tricuspid stenosis. Aortic Valve: The aortic valve is tricuspid. Aortic valve regurgitation is trivial. No aortic stenosis is present. Pulmonic Valve: The pulmonic valve was normal in structure. Pulmonic valve regurgitation is not visualized. No  evidence of pulmonic stenosis. Aorta: The aortic root is normal in size and structure. Venous: The inferior vena cava is normal in size with greater than 50% respiratory variability, suggesting right atrial pressure of 3 mmHg. IAS/Shunts: No atrial level shunt detected by color flow Doppler.  LEFT VENTRICLE PLAX 2D LVIDd:         4.80 cm  Diastology LVIDs:         3.30 cm  LV e' medial:    7.94 cm/s LV PW:         1.00 cm  LV E/e' medial:  7.6 LV IVS:        1.00 cm  LV e' lateral:   9.68 cm/s LVOT diam:     2.20 cm  LV E/e' lateral: 6.2 LV SV:         71 LV SV Index:   31 LVOT Area:     3.80 cm  RIGHT VENTRICLE             IVC RV S prime:     12.50 cm/s  IVC diam: 1.60 cm TAPSE (M-mode): 2.4 cm LEFT ATRIUM             Index       RIGHT ATRIUM           Index LA diam:        4.30 cm 1.89 cm/m  RA Area:     12.40 cm LA Vol (A2C):   57.0 ml 25.07 ml/m RA Volume:   28.10 ml  12.36 ml/m LA Vol (A4C):   38.4 ml 16.89 ml/m LA Biplane Vol: 47.7 ml 20.98 ml/m  AORTIC VALVE LVOT Vmax:   87.40 cm/s LVOT Vmean:  55.900 cm/s LVOT VTI:    0.188 m  AORTA Ao Root diam: 3.40 cm Ao Asc diam:  3.50 cm MITRAL VALVE               TRICUSPID VALVE MV Area (PHT): 2.99 cm    TR Peak grad:   14.0 mmHg MV Decel Time: 254 msec    TR Vmax:        187.11 cm/s MV E velocity: 60.20 cm/s MV A velocity: 74.40 cm/s  SHUNTS MV E/A ratio:  0.81        Systemic VTI:  0.19 m                             Systemic Diam: 2.20 cm Chilton Si MD Electronically signed by Chilton Si MD Signature Date/Time: 09/04/2021/11:17:16 PM    Final     Cardiac Studies   2D echocardiogram (09/04/2021)  IMPRESSIONS     1. Left ventricular ejection fraction, by estimation, is 55 to 60%. The  left ventricle has normal function. The left ventricle has no regional  wall motion abnormalities. Left ventricular diastolic parameters are  consistent with Grade I diastolic  dysfunction (impaired relaxation).   2. Right ventricular systolic function is normal. The right ventricular  size is normal. There is normal pulmonary artery systolic pressure.   3. The mitral valve is normal in structure. Trivial mitral valve  regurgitation. No evidence of mitral stenosis.   4. The aortic valve is tricuspid. Aortic valve regurgitation is trivial.  No aortic stenosis is present.   5. The inferior vena cava is normal in size with greater than 50%  respiratory variability, suggesting right atrial pressure of 3 mmHg.   Cardiac catheterization/PCI and drug-eluting  stent (09/05/2019)  Conclusion      Prox LAD to Mid LAD lesion is 75% stenosed.   Post intervention, there is a 0% residual stenosis.   1.  Total occlusion of the mid circumflex, treated with PCI using a 2.5 x 15 mm Onyx frontier DES 2.  Severe stenosis of the proximal LAD, treated with PCI using a 3.5 x 26 mm Onyx frontier DES 3.  Severe stenosis of the first diagonal branch of the LAD, treated with PCI using a 2.75 x 15 mm Onyx frontier DES 4.  Patent, dominant RCA with mild diffuse plaquing and no significant stenosis 5.  Normal LV systolic function with LVEF estimated at 55 to 65%  Recommendations: 2D echo, aggressive medical therapy with LDL goal less than 70 mg/dL, dual antiplatelet therapy with aspirin and Brilinta at least 12 months, as long as no complications arise the patient should be eligible for hospital discharge  tomorrow.  Intervention   Patient Profile     60 y.o. male with no cardiac risk factors who presented to med [facility] yesterday with chest pain.  He was transferred urgently to [facility] where he underwent diagnostic cath by Dr. Excell Seltzer with implantation of 3 drug-eluting stents.  His infarct-related artery was and distal AV groove circumflex.  He is asymptomatic today.  Assessment & Plan    1: CAD-postop day 1 circumflex infarct status post PCI and drug-eluting stenting of the AV groove circumflex, proximal LAD and first diagonal branch.  He is on dual antiplatelet therapy.  Is asymptomatic.  2: Hyperlipidemia-LDL 120.  On high-dose atorvastatin.  Plan start beta-blocker today.  Transfer to telemetry, ambulate with cardiac rehab.  Anticipate discharge tomorrow.  For questions or updates, please contact [facility] Please consult [email or web address] for contact info under        Signed, Nanetta Batty, MD  09/05/2021, 8:09 AM

## 2021-09-05 NOTE — Progress Notes (Signed)
CARDIAC REHAB PHASE I   PRE:  Rate/Rhythm: 54 SB  BP:  Sitting: 153/97      SaO2: 97 RA  MODE:  Ambulation: 370 ft   POST:  Rate/Rhythm: 64 SR  BP:  Sitting: 161/108    SaO2: 98 RA   Pt ambulated 363ft in hallway independently with steady gait. Pt denies CP, SOB, or dizziness throughout walk. Pt returned to bed. Educated on importance of ASA, Brilinta, statin, and NTG. Pt given MI book, stent cards, and heart healthy diet. Reviewed site care, restrictions, and exercise guidelines. Will refer to CRP II [place]. Will continue to follow.  [id number] Reynold Bowen, RN BSN 09/05/2021 11:36 AM

## 2021-09-06 ENCOUNTER — Other Ambulatory Visit (HOSPITAL_COMMUNITY): Payer: Self-pay

## 2021-09-06 LAB — BASIC METABOLIC PANEL
Anion gap: 8 (ref 5–15)
BUN: 16 mg/dL (ref 6–20)
CO2: 25 mmol/L (ref 22–32)
Calcium: 9.2 mg/dL (ref 8.9–10.3)
Chloride: 103 mmol/L (ref 98–111)
Creatinine, Ser: 1.29 mg/dL — ABNORMAL HIGH (ref 0.61–1.24)
GFR, Estimated: >60 mL/min (ref >60)
Glucose, Bld: 98 mg/dL (ref 70–99)
Potassium: 4.3 mmol/L (ref 3.5–5.1)
Sodium: 136 mmol/L (ref 135–145)

## 2021-09-06 MED ORDER — ASPIRIN 81 MG PO TBEC
81.0000 mg | DELAYED_RELEASE_TABLET | Freq: Every day | ORAL | 3 refills | Status: DC
  Filled 2021-09-06: qty 90, 90d supply, fill #0

## 2021-09-06 MED ORDER — NITROGLYCERIN 0.4 MG SL SUBL
0.4000 mg | SUBLINGUAL_TABLET | SUBLINGUAL | 1 refills | Status: AC | PRN
  Filled 2021-09-06: qty 25, 7d supply, fill #0

## 2021-09-06 MED ORDER — TICAGRELOR 90 MG PO TABS
90.0000 mg | ORAL_TABLET | Freq: Two times a day (BID) | ORAL | 3 refills | Status: DC
  Filled 2021-09-06: qty 60, 30d supply, fill #0

## 2021-09-06 MED ORDER — ATORVASTATIN CALCIUM 80 MG PO TABS
80.0000 mg | ORAL_TABLET | Freq: Every day | ORAL | 3 refills | Status: DC
  Filled 2021-09-06: qty 30, 30d supply, fill #0

## 2021-09-06 MED ORDER — METOPROLOL TARTRATE 25 MG PO TABS
12.5000 mg | ORAL_TABLET | Freq: Two times a day (BID) | ORAL | 11 refills | Status: DC
  Filled 2021-09-06: qty 30, 30d supply, fill #0

## 2021-09-06 NOTE — Discharge Summary (Addendum)
Discharge Summary    Patient ID: Donald Jimenez MRN: [id number]; DOB: 07-13-1961  Admit date: 09/04/2021 Discharge date: 09/06/2021  PCP:  [facility] Providers Cardiologist:  Tonny Bollman, MD   {   Discharge Diagnoses    Active Problems:   Non-ST elevation (NSTEMI) myocardial infarction [facility])   NSTEMI (non-ST elevated myocardial infarction) [facility])   CAD   HLD  Diagnostic Studies/Procedures    2D echocardiogram (09/04/2021)   IMPRESSIONS     1. Left ventricular ejection fraction, by estimation, is 55 to 60%. The  left ventricle has normal function. The left ventricle has no regional  wall motion abnormalities. Left ventricular diastolic parameters are  consistent with Grade I diastolic  dysfunction (impaired relaxation).   2. Right ventricular systolic function is normal. The right ventricular  size is normal. There is normal pulmonary artery systolic pressure.   3. The mitral valve is normal in structure. Trivial mitral valve  regurgitation. No evidence of mitral stenosis.   4. The aortic valve is tricuspid. Aortic valve regurgitation is trivial.  No aortic stenosis is present.   5. The inferior vena cava is normal in size with greater than 50%  respiratory variability, suggesting right atrial pressure of 3 mmHg.    Cardiac catheterization/PCI and drug-eluting stent (09/05/2019)   Conclusion       Prox LAD to Mid LAD lesion is 75% stenosed.   Post intervention, there is a 0% residual stenosis.   1.  Total occlusion of the mid circumflex, treated with PCI using a 2.5 x 15 mm Onyx frontier DES 2.  Severe stenosis of the proximal LAD, treated with PCI using a 3.5 x 26 mm Onyx frontier DES 3.  Severe stenosis of the first diagonal branch of the LAD, treated with PCI using a 2.75 x 15 mm Onyx frontier DES 4.  Patent, dominant RCA with mild diffuse plaquing and no significant stenosis 5.  Normal LV systolic function with LVEF  estimated at 55 to 65%  Recommendations: 2D echo, aggressive medical therapy with LDL goal less than 70 mg/dL, dual antiplatelet therapy with aspirin and Brilinta at least 12 months, as long as no complications arise the patient should be eligible for hospital discharge tomorrow.  Diagnostic Dominance: Right Intervention     History of Present Illness     Donald Jimenez is a 60 y.o. male with no significant past medical history admitted for chest pain and EKG concerning for dynamic changes as code STEMI.  Donald Jimenez is transferred from [facility] emergency department as a code STEMI.  He has had stuttering chest pain over the last 48 hours with 15 to 20-minute episodes of substernal chest pressure multiple times. The patient has had no chest pain since arrival at the [facility] emergency room.  He has no past history of similar symptoms.  There is no family history of coronary artery disease, CABG, or MI.  The patient is a lifelong non-smoker.  He has no personal history of hypertension, diabetes, or hyperlipidemia.  Because of dynamic EKG  changes (ST/T wave changes consider acute posterior infarction versus anterior ischemia) and elevated troponin, he is brought emergently for cardiac catheterization and possible PCI.  Received heparin bolus and aspirin prior to transfer.  Hospital Course     Consultants: None   STEMI Emergent cardiac catheterization showed total occlusion of the circumflex leading to STEMI.  He also had severe stenosis of proximal LAD  and diagonal branch. He underwent successful PCI with DES to Cx, LAD and diagonal. No recurrent chest pain. Echo showed normal LVEF and grade 1 DD. No recurrent chest pain.  Ambulated well. -Dual antiplatelet therapy for 12 months with aspirin and Brilinta -Continue statin and beta-blocker  2.  Hyperlipidemia - 09/05/2021: Cholesterol 191; HDL 41; LDL Cholesterol 120; Triglycerides 148; VLDL 30  -Treated with high  intensity statin.  Lipid panel and LFTs in 8 weeks.   Did the patient have an acute coronary syndrome (MI, NSTEMI, STEMI, etc) this admission?:  Yes                               AHA/ACC Clinical Performance & Quality Measures: Aspirin prescribed? - Yes ADP Receptor Inhibitor (Plavix/Clopidogrel, Brilinta/Ticagrelor or Effient/Prasugrel) prescribed (includes medically managed patients)? - Yes Beta Blocker prescribed? - Yes High Intensity Statin (Lipitor 40-80mg  or Crestor 20-40mg ) prescribed? - Yes EF assessed during THIS hospitalization? - Yes For EF <40%, was ACEI/ARB prescribed? - Not Applicable (EF >/= 40%) For EF <40%, Aldosterone Antagonist (Spironolactone or Eplerenone) prescribed? - Not Applicable (EF >/= 40%) Cardiac Rehab Phase II ordered (including medically managed patients)? - Yes      Discharge Vitals Blood pressure (!) 134/91, pulse 61, temperature 98 F (36.7 C), temperature source Oral, resp. rate 18, height 6\' 2"  (1.88 m), weight 96.2 kg, SpO2 95 %.  Filed Weights   09/04/21 1009 09/04/21 1211 09/05/21 1528  Weight: 95.3 kg 100.9 kg 96.2 kg   Physical Exam Constitutional:      Appearance: He is well-developed.  Eyes:     Pupils: Pupils are equal, round, and reactive to light.  Cardiovascular:     Rate and Rhythm: Normal rate and regular rhythm.     Heart sounds: Normal heart sounds.     Comments: Radial cath site without hematoma Pulmonary:     Effort: Pulmonary effort is normal.     Breath sounds: Normal breath sounds.  Abdominal:     Palpations: Abdomen is soft.  Musculoskeletal:        General: Normal range of motion.  Skin:    General: Skin is warm and dry.  Neurological:     General: No focal deficit present.     Mental Status: He is alert.  Psychiatric:        Mood and Affect: Mood normal.        Behavior: Behavior normal.    Labs & Radiologic Studies    CBC Recent Labs    09/04/21 1020 09/05/21 0240  WBC 7.3 11.1*  HGB 16.0 14.2  HCT  47.2 41.6  MCV 88.7 88.9  PLT 277 236   Basic Metabolic Panel Recent Labs    09/07/21 0240 09/06/21 0203  NA 137 136  K 4.2 4.3  CL 105 103  CO2 27 25  GLUCOSE 90 98  BUN 18 16  CREATININE 1.31* 1.29*  CALCIUM 9.1 9.2    High Sensitivity Troponin:   Recent Labs  Lab 09/04/21 1020 09/04/21 1215  TROPONINIHS 79* 75*     Fasting Lipid Panel Recent Labs    09/05/21 0240  CHOL 191  HDL 41  LDLCALC 120*  TRIG 148  CHOLHDL 4.7    _____________  DG Chest 2 View  Result Date: 09/04/2021 CLINICAL DATA:  Chest pain EXAM: CHEST - 2 VIEW COMPARISON:  None. FINDINGS: The heart size and mediastinal contours are within normal limits. Both  lungs are clear. The visualized skeletal structures are unremarkable. IMPRESSION: Normal study. Electronically Signed   By: Charlett Nose M.D.   On: 09/04/2021 10:48   CARDIAC CATHETERIZATION  Result Date: 09/04/2021   Prox LAD to Mid LAD lesion is 75% stenosed.   Post intervention, there is a 0% residual stenosis. 1.  Total occlusion of the mid circumflex, treated with PCI using a 2.5 x 15 mm Onyx frontier DES 2.  Severe stenosis of the proximal LAD, treated with PCI using a 3.5 x 26 mm Onyx frontier DES 3.  Severe stenosis of the first diagonal branch of the LAD, treated with PCI using a 2.75 x 15 mm Onyx frontier DES 4.  Patent, dominant RCA with mild diffuse plaquing and no significant stenosis 5.  Normal LV systolic function with LVEF estimated at 55 to 65% Recommendations: 2D echo, aggressive medical therapy with LDL goal less than 70 mg/dL, dual antiplatelet therapy with aspirin and Brilinta at least 12 months, as long as no complications arise the patient should be eligible for hospital discharge tomorrow.   ECHOCARDIOGRAM COMPLETE  Result Date: 09/04/2021    ECHOCARDIOGRAM REPORT   [name] Date of Exam: 09/04/2021 Medical Rec #:  [id number]    Height:       74.0 in Accession #:    [id number]   Weight:       222.4 lb Date  of Birth:  08-21-1961    BSA:          2.274 m Patient Age:    60 years     BP:           119/83 mmHg Patient Gender: M            HR:           62 bpm. Exam Location:  Inpatient Procedure: 2D Echo Indications:    NSTEMI  History:        Patient has no prior history of Echocardiogram examinations.  Sonographer:    Delcie Roch RDCS Referring Phys: [phone] MICHAEL COOPER IMPRESSIONS  1. Left ventricular ejection fraction, by estimation, is 55 to 60%. The left ventricle has normal function. The left ventricle has no regional wall motion abnormalities. Left ventricular diastolic parameters are consistent with Grade I diastolic dysfunction (impaired relaxation).  2. Right ventricular systolic function is normal. The right ventricular size is normal. There is normal pulmonary artery systolic pressure.  3. The mitral valve is normal in structure. Trivial mitral valve regurgitation. No evidence of mitral stenosis.  4. The aortic valve is tricuspid. Aortic valve regurgitation is trivial. No aortic stenosis is present.  5. The inferior vena cava is normal in size with greater than 50% respiratory variability, suggesting right atrial pressure of 3 mmHg. FINDINGS  Left Ventricle: Left ventricular ejection fraction, by estimation, is 55 to 60%. The left ventricle has normal function. The left ventricle has no regional wall motion abnormalities. The left ventricular internal cavity size was normal in size. There is  no left ventricular hypertrophy. Left ventricular diastolic parameters are consistent with Grade I diastolic dysfunction (impaired relaxation). Normal left ventricular filling pressure. Right Ventricle: The right ventricular size is normal. No increase in right ventricular wall thickness. Right ventricular systolic function is normal. There is normal pulmonary artery systolic pressure. The tricuspid regurgitant velocity is 1.87 m/s, and  with an assumed right atrial pressure of 3 mmHg, the estimated right  ventricular systolic pressure is 17.0 mmHg. Left Atrium: Left  atrial size was normal in size. Right Atrium: Right atrial size was normal in size. Pericardium: There is no evidence of pericardial effusion. Mitral Valve: The mitral valve is normal in structure. Trivial mitral valve regurgitation. No evidence of mitral valve stenosis. Tricuspid Valve: The tricuspid valve is normal in structure. Tricuspid valve regurgitation is trivial. No evidence of tricuspid stenosis. Aortic Valve: The aortic valve is tricuspid. Aortic valve regurgitation is trivial. No aortic stenosis is present. Pulmonic Valve: The pulmonic valve was normal in structure. Pulmonic valve regurgitation is not visualized. No evidence of pulmonic stenosis. Aorta: The aortic root is normal in size and structure. Venous: The inferior vena cava is normal in size with greater than 50% respiratory variability, suggesting right atrial pressure of 3 mmHg. IAS/Shunts: No atrial level shunt detected by color flow Doppler.  LEFT VENTRICLE PLAX 2D LVIDd:         4.80 cm  Diastology LVIDs:         3.30 cm  LV e' medial:    7.94 cm/s LV PW:         1.00 cm  LV E/e' medial:  7.6 LV IVS:        1.00 cm  LV e' lateral:   9.68 cm/s LVOT diam:     2.20 cm  LV E/e' lateral: 6.2 LV SV:         71 LV SV Index:   31 LVOT Area:     3.80 cm  RIGHT VENTRICLE             IVC RV S prime:     12.50 cm/s  IVC diam: 1.60 cm TAPSE (M-mode): 2.4 cm LEFT ATRIUM             Index       RIGHT ATRIUM           Index LA diam:        4.30 cm 1.89 cm/m  RA Area:     12.40 cm LA Vol (A2C):   57.0 ml 25.07 ml/m RA Volume:   28.10 ml  12.36 ml/m LA Vol (A4C):   38.4 ml 16.89 ml/m LA Biplane Vol: 47.7 ml 20.98 ml/m  AORTIC VALVE LVOT Vmax:   87.40 cm/s LVOT Vmean:  55.900 cm/s LVOT VTI:    0.188 m  AORTA Ao Root diam: 3.40 cm Ao Asc diam:  3.50 cm MITRAL VALVE               TRICUSPID VALVE MV Area (PHT): 2.99 cm    TR Peak grad:   14.0 mmHg MV Decel Time: 254 msec    TR Vmax:         187.11 cm/s MV E velocity: 60.20 cm/s MV A velocity: 74.40 cm/s  SHUNTS MV E/A ratio:  0.81        Systemic VTI:  0.19 m                            Systemic Diam: 2.20 cm Chilton Si MD Electronically signed by Chilton Si MD Signature Date/Time: 09/04/2021/11:17:16 PM    Final    Disposition   Pt is being discharged home today in good condition.  Follow-up Plans & Appointments     Follow-up Information     [place], Sharrell Ku, [place] Follow up on 09/26/2021.   Specialty: Cardiology Why: @ 3:15 pm for hospital follow up Contact information: [address] [place]  [address] [phone]                Discharge Instructions     Amb Referral to Cardiac Rehabilitation   Complete by: As directed    Diagnosis:  Coronary Stents NSTEMI     After initial evaluation and assessments completed: Virtual Based Care may be provided alone or in conjunction with Phase 2 Cardiac Rehab based on patient barriers.: Yes   Diet - low sodium heart healthy   Complete by: As directed    Discharge instructions   Complete by: As directed    No driving for 1 weeks. No lifting over 10 lbs for 3 weeks. No sexual activity for 4 weeks. You may not return to work until cleared by your cardiologist. Keep procedure site clean & dry. If you notice increased pain, swelling, bleeding or pus, call/return!  You may shower, but no soaking baths/hot tubs/pools for 1 week.   Increase activity slowly   Complete by: As directed        Discharge Medications   Allergies as of 09/06/2021   No Known Allergies      Medication List     TAKE these medications    aspirin 81 MG EC tablet Take 1 tablet (81 mg total) by mouth daily. Swallow whole.   atorvastatin 80 MG tablet Commonly known as: LIPITOR Take 1 tablet (80 mg total) by mouth daily.   cholecalciferol 25 MCG (1000 UNIT) tablet Commonly known as: VITAMIN D3 Take 1,000 Units by mouth daily.   ibuprofen 200 MG tablet Commonly  known as: ADVIL Take 800 mg by mouth every 6 (six) hours as needed for mild pain.   metoprolol tartrate 25 MG tablet Commonly known as: LOPRESSOR Take 1/2 tablet (12.5 mg total) by mouth 2 (two) times daily.   nitroGLYCERIN 0.4 MG SL tablet Commonly known as: NITROSTAT Place 1 tablet (0.4 mg total) under the tongue every 5 (five) minutes x 3 doses as needed for chest pain.   Omega-3 1000 MG Caps Take 1 capsule by mouth daily.   therapeutic multivitamin-minerals tablet Take 1 tablet by mouth daily.   ticagrelor 90 MG Tabs tablet Commonly known as: BRILINTA Take 1 tablet (90 mg total) by mouth 2 (two) times daily.   vitamin C 100 MG tablet Take 100 mg by mouth daily.           Outstanding Labs/Studies   Consider OP f/u labs 6-8 weeks given statin initiation this admission.   Duration of Discharge Encounter   Greater than 30 minutes including physician time.  SignedSharrell Ku [place], PA 09/06/2021, 9:39 AM  Agree with note by Chelsea Aus PA-C  Donald Jimenez was admitted with a non-STEMI.  He underwent multivessel intervention by Dr. Excell Seltzer of his circumflex which is his "culprit lesion which was subtotally occluded, proximal LAD and first diagonal branch.  His EF was normal.  He is on dual antiplatelet therapy with aspirin and Brilinta as well as high-dose statin therapy, beta-blocker.  He is ambulating in the hallway without symptoms.  His exam is benign.  He stable for discharge.  TOC 7 is already been arranged for 9/26 and then follow-up with Dr. Excell Seltzer as an outpatient.  Runell Gess, M.D., FACP, [facility], [facility] [address] [place], [place]  [address]  [phone] 09/06/2021 11:02 AM

## 2021-09-06 NOTE — Progress Notes (Signed)
CARDIAC REHAB PHASE I   Offered to walk with pt. Pt states independent ambulation without difficulty. Feels better today as he slept last night. Reinforced MI/stent education. Pt requesting note for work, PA aware. Referred to CRP II [place].  [phone] Reynold Bowen, RN BSN 09/06/2021 9:02 AM

## 2021-09-09 ENCOUNTER — Telehealth: Payer: Self-pay | Admitting: Cardiovascular Disease

## 2021-09-09 MED ORDER — METOPROLOL TARTRATE 25 MG PO TABS: 12.5000 mg | ORAL_TABLET | Freq: Every morning | ORAL | 11 refills | Status: DC

## 2021-09-09 NOTE — Telephone Encounter (Signed)
Pt advised Dr. Anne Fu recommendation to only take the Metoprolol 12.5 mg in the morning only.. he will check his HR and BP 2 hours after taking his med.. and sitting for about 15-20 min with both feet on the ground and call us in a few days and let us know his readings.

## 2021-09-09 NOTE — Telephone Encounter (Signed)
STAT if HR is under 50 or over 120 (normal HR is 60-100 beats per minute)  What is your heart rate? 48  Do you have a log of your heart rate readings (document readings)? no  Do you have any other symptoms? No, he had stints put in on last Wednesday

## 2021-09-09 NOTE — Telephone Encounter (Signed)
Pt called to report that since his MI 09/04/21 and he started on Metoprolol 12.5 mg bid... his HR in the hosp at discharge was in the 50's and he was told that is okay.. his normal prior to the MI was in the 70's.   He is feeling well just tired and his HR stays in the 50's but has dropped into the high 40's a few times but just during random checks.   His BP has been 130/70's.   Pt advised to continue to monitor. To only check in the morning and night unless he does not feel well.   He will continue to monitor, he will hydrate and eat well... he will call if anything changes or worsens.   I will forward to the DOD for review and any recommendations since Dr. Excell Seltzer is out fo the office this week re: his meds if any changes should be made... his next OV for post hosp is 09/26/21.

## 2021-09-09 NOTE — Telephone Encounter (Signed)
Why do we cut his metoprolol tartrate to 12.5 mg once a day.  Lets see how he does. Donald Schultz, MD

## 2021-09-11 ENCOUNTER — Telehealth: Payer: Self-pay | Admitting: *Deleted

## 2021-09-11 ENCOUNTER — Telehealth (HOSPITAL_COMMUNITY): Payer: Self-pay

## 2021-09-11 NOTE — Telephone Encounter (Signed)
Called patient to see if he is interested in the Cardiac Rehab Program. Patient expressed interest. Explained scheduling process and went over insurance, patient verbalized understanding. Will contact patient for scheduling once f/u has been completed.  °

## 2021-09-11 NOTE — Telephone Encounter (Signed)
   [facility] Pre-operative Risk Assessment    Patient Name: Donald Jimenez  DOB: May 07, 1961 MRN: [id number]  HEARTCARE STAFF:  - IMPORTANT!!!!!! Under Visit Info/Reason for Call, type in Other and utilize the format Clearance MM/DD/YY or Clearance TBD. Do not use dashes or single digits. - Please review there is not already an duplicate clearance open for this procedure. - If request is for dental extraction, please clarify the # of teeth to be extracted. - If the patient is currently at the dentist's office, call Pre-Op Callback Staff (MA/nurse) to input urgent request.  - If the patient is not currently in the dentist office, please route to the Pre-Op pool.  Request for surgical clearance:  What type of surgery is being performed?  CLEANING & RADIOGRAPHS  When is this surgery scheduled?  TBD  What type of clearance is required (medical clearance vs. Pharmacy clearance to hold med vs. Both)?  BOTH  Are there any medications that need to be held prior to surgery and how long?  [place] name and name of physician performing surgery?  [facility] / Donald Jimenez  What is the office phone number?  [phone]   7.   What is the office fax number?  [phone]  8.   Anesthesia type (None, local, MAC, general) ?     Donald Jimenez 09/11/2021, 10:20 AM  _________________________________________________________________   (provider comments below)

## 2021-09-11 NOTE — Telephone Encounter (Signed)
Pt insurance is active and benefits verified through [place] $10, DED $500/$214.85 met, out of pocket $5,000/$383.66 met, co-insurance 0%. no pre-authorization required. Passport, 09/11/2021_0 :20pm, REF# [id number]   Will contact patient to see if he is interested in the Cardiac Rehab Program. If interested, patient will need to complete follow up appt. Once completed, patient will be contacted for scheduling upon review by the RN Navigator.

## 2021-09-11 NOTE — Telephone Encounter (Signed)
   Patient Name: Donald Jimenez  DOB: [birth year] MRN: [id number]  Primary Cardiologist: Tonny Bollman, MD  Chart reviewed as part of pre-operative protocol coverage for radiographs and dental cleaning. Patient was just admitted this past week for STEMI on 9/14 - per notes, "Emergent cardiac catheterization showed total occlusion of the circumflex leading to STEMI.  He also had severe stenosis of proximal LAD and diagonal branch. He underwent successful PCI with DES to Cx, LAD and diagonal."  I will reach out to Dr. Excell Seltzer for input on whether he may proceed with dental cleanings (or ideal timeframe to defer to given very recent MI). Obviously we cannot stop ASA and Brilinta at this time. Otherwise no SBE needs identified. Dr. Excell Seltzer - Please route response to P CV DIV PREOP (the pre-op pool). Thank you.   Laurann Montana, PA-C 09/11/2021, 11:16 AM

## 2021-09-13 ENCOUNTER — Telehealth: Payer: Self-pay | Admitting: Cardiovascular Disease

## 2021-09-13 DIAGNOSIS — Z006 Encounter for examination for normal comparison and control in clinical research program: Secondary | ICD-10-CM

## 2021-09-13 NOTE — Telephone Encounter (Signed)
New message:     09/10/21 8 am 127/83 pulse 65, 11:30 am 135/92 pulse 58, 4 pm 138/82 pulse 56, 8:15 pm 146/89 pluse 62   09/11/21 8:15 am 127/85 pulse 59, 12:15 pm 123/85 pulse 61, 4:15 pm 124/84 pulse 61, 9:45 pm 136/84 pulse 58    09/12/21 8 am 127/85 pulse 63, 12:30 pm 125/77 pulse 62, 5:15 pm 123/88 pulse 62, 9 pm 130/79 pulse 62,   09/13/21 8 am 133/83 pulse 65

## 2021-09-13 NOTE — Research (Signed)
Called and spoke to patient about [employer] Lpa trial. Stated he would talk it over with his wife and daughter and call back

## 2021-09-15 NOTE — Telephone Encounter (Signed)
Vital signs look good. Continue same medications.

## 2021-09-15 NOTE — Telephone Encounter (Signed)
Fine to proceed with dental cleaning. No SBE prophylaxis indicated. Pt unable to hold blood thinning medications.

## 2021-09-16 NOTE — Telephone Encounter (Signed)
Gave patient advisement and answered any questions.  Patient verbalized understanding.

## 2021-09-16 NOTE — Telephone Encounter (Signed)
   Patient Name: Donald Jimenez  DOB: Jul 16, 1961 MRN: [id number]  Primary Cardiologist: Tonny Bollman, MD  Chart reviewed as part of pre-operative protocol coverage. Patient was admitted for  acute MI on 9/14 - per notes, "Emergent cardiac catheterization showed total occlusion of the circumflex leading to STEMI.  He also had severe stenosis of proximal LAD and diagonal branch. He underwent successful PCI with DES to Cx, LAD and diagonal." Given recent MI, I reached out to Dr. Excell Seltzer for input on proceeding with dental procedure. Per Dr. Excell Seltzer, "Fine to proceed with dental cleaning. No SBE prophylaxis indicated. Pt unable to hold blood thinning medications. " (Given patient's recent MI and stenting, he is NOT ALLOWED to hold ASA and Brilinta at this time. We typically recommend these patients continue uninterrupted dual antiplatelet therapy for a minimum of 12 months.)  Will route this bundled recommendation to requesting provider via Epic fax function. Please call with questions.  Laurann Montana, PA-C 09/16/2021, 9:53 AM

## 2021-09-25 DIAGNOSIS — Z006 Encounter for examination for normal comparison and control in clinical research program: Secondary | ICD-10-CM

## 2021-09-25 NOTE — Research (Signed)
Called and spoke with patient about Lpa study. He still wants to talk it over with his wife and daughter. Will call back and touch base with him next week

## 2021-09-26 ENCOUNTER — Encounter: Payer: Self-pay | Admitting: *Deleted

## 2021-09-26 ENCOUNTER — Telehealth (HOSPITAL_COMMUNITY): Payer: Self-pay | Admitting: Pharmacist

## 2021-09-26 ENCOUNTER — Other Ambulatory Visit: Payer: Self-pay

## 2021-09-26 ENCOUNTER — Other Ambulatory Visit (HOSPITAL_COMMUNITY): Payer: Self-pay

## 2021-09-26 ENCOUNTER — Ambulatory Visit: Payer: 59 | Admitting: Physician Assistant

## 2021-09-26 ENCOUNTER — Encounter: Payer: Self-pay | Admitting: Physician Assistant

## 2021-09-26 VITALS — BP 124/90 | HR 54 | Ht 74.0 in | Wt 203.2 lb

## 2021-09-26 DIAGNOSIS — I214 Non-ST elevation (NSTEMI) myocardial infarction: Secondary | ICD-10-CM

## 2021-09-26 DIAGNOSIS — I2510 Atherosclerotic heart disease of native coronary artery without angina pectoris: Secondary | ICD-10-CM

## 2021-09-26 DIAGNOSIS — E785 Hyperlipidemia, unspecified: Secondary | ICD-10-CM

## 2021-09-26 MED ORDER — ISOSORBIDE MONONITRATE ER 30 MG PO TB24: 30.0000 mg | ORAL_TABLET | Freq: Every day | ORAL | 3 refills | Status: DC

## 2021-09-26 NOTE — Telephone Encounter (Signed)
Pharmacy Transitions of Care Follow-up Telephone Call  Date of discharge: 09/06/2021  Discharge Diagnosis: NSTEMI  How have you been since you were released from the hospital? Pretty Good   Medication changes made at discharge:  - START: Low dose ASA, atorvastatin, Brilinta, metoprolol tartrate, nitroglycerin  - STOPPED:   - CHANGED:   Medication changes verified by the patient? Yes    Medication Accessibility:  Home Pharmacy: [facility]   Was the patient provided with refills on discharged medications? Yes   Have all prescriptions been transferred from [facility] to home pharmacy? Yes   Is the patient able to afford medications? Yes Notable copays:  Eligible patient assistance:     Medication Review: TICAGRELOR (BRILINTA) Ticagrelor 90 mg BID initiated on .  - Educated patient on expected duration of therapy of aspirin with ticagrelor. Advised patient that dose of ticagrelor will be reduced after . Aspirin will be continued indefinitely/discontinued. - Discussed importance of taking medication around the same time every day, - Reviewed potential DDIs with patient - Advised patient of medications to avoid (NSAIDs, aspirin maintenance doses>100 mg daily) - Educated that Tylenol (acetaminophen) will be the preferred analgesic to prevent risk of bleeding  - Emphasized importance of monitoring for signs and symptoms of bleeding (abnormal bruising, prolonged bleeding, nose bleeds, bleeding from gums, discolored urine, black tarry stools)  - Educated patient to notify doctor if shortness of breath or abnormal heartbeat occur - Advised patient to alert all providers of antiplatelet therapy prior to starting a new medication or having a procedure   Follow-up Appointments:   [facility]t confirmed? Today .   If their condition worsens, is the pt aware to call PCP or go to the Emergency Dept.? Yes  Final Patient Assessment: Patient reports he is doing well since  discharge.  He has follow up appointment with cardiology today.  He has not had to use any nitroglycerin.  Medications were reviewed.  He has been monitoring pulse and blood pressure.  Metoprolol dose was cut in half due to low pulse.

## 2021-09-26 NOTE — Patient Instructions (Signed)
Medication Instructions:  Your physician has recommended you make the following change in your medication:   STOP Metoprolol  START Imdur 30 mg taking 1 daily    *If you need a refill on your cardiac medications before your next appointment, please call your pharmacy*   Lab Work: None ordered  If you have labs (blood work) drawn today and your tests are completely normal, you will receive your results only by: MyChart Message (if you have MyChart) OR A paper copy in the mail If you have any lab test that is abnormal or we need to change your treatment, we will call you to review the results.   Testing/Procedures: None ordered   Follow-Up: At [facility], you and your health needs are our priority.  As part of our continuing mission to provide you with exceptional heart care, we have created designated Provider Care Teams.  These Care Teams include your primary Cardiologist (physician) and Advanced Practice Providers (APPs -  Physician Assistants and Nurse Practitioners) who all work together to provide you with the care you need, when you need it.  We recommend signing up for the patient portal called "MyChart".  Sign up information is provided on this After Visit Summary.  MyChart is used to connect with patients for Virtual Visits (Telemedicine).  Patients are able to view lab/test results, encounter notes, upcoming appointments, etc.  Non-urgent messages can be sent to your provider as well.   To learn more about what you can do with MyChart, go to [email or web address].    Your next appointment:   10/22/2021  ARRIVE AT 8:30  The format for your next appointment:   In Person  Provider:   Ronie Spies, PA-C   Other Instructions

## 2021-09-26 NOTE — Progress Notes (Signed)
Cardiology Office Note:    Date:  09/26/2021   ID:  Bryan Lemma, DOB 1961-09-24, MRN [id number]  PCP:  Joycelyn Rua, MD  [facility] Cardiologist:  Tonny Bollman, MD  [facility] Electrophysiologist:  None   Chief Complaint: hospital follow up   History of Present Illness:    Donald Jimenez is a 60 y.o. male with no significant past medical history recently presented to ER with chest pain.  EKG concerning for dynamic changes and code STEMI was activated.Emergent cardiac catheterization showed total occlusion of the circumflex leading to STEMI.  He also had severe stenosis of proximal LAD and diagonal branch. He underwent successful PCI with DES to Cx, LAD and diagonal. Echo showed normal LVEF and grade 1 DD. No recurrent chest pain.  Patient is here for follow-up.  His dizziness has improved since reducing metoprolol tartrate to 12.5 mg daily but did not resolved.  Also reports intermittent achiness all across his chest.  His presenting symptoms were different, it was substernal chest tightness.  He denies shortness of breath, palpitation, orthopnea, PND, syncope, lower extremity edema or melena.  Compliant with his medication.   Past Medical History:  Diagnosis Date   CAD (coronary artery disease), native coronary artery 09/04/2021   s/p DES to Cx, LAD and diagonal   HLD (hyperlipidemia)     Past Surgical History:  Procedure Laterality Date   CORONARY/GRAFT ACUTE MI REVASCULARIZATION N/A 09/04/2021   Procedure: Coronary/Graft Acute MI Revascularization;  Surgeon: Tonny Bollman, MD;  Location: [facility] INVASIVE CV LAB;  Service: Cardiovascular;  Laterality: N/A;   LEFT HEART CATH AND CORONARY ANGIOGRAPHY N/A 09/04/2021   Procedure: LEFT HEART CATH AND CORONARY ANGIOGRAPHY;  Surgeon: Tonny Bollman, MD;  Location: [facility] INVASIVE CV LAB;  Service: Cardiovascular;  Laterality: N/A;    Current Medications: Current Meds  Medication Sig   Ascorbic Acid (VITAMIN C) 100 MG tablet Take 100  mg by mouth daily.   aspirin 81 MG EC tablet Take 1 tablet (81 mg total) by mouth daily. Swallow whole.   atorvastatin (LIPITOR) 80 MG tablet Take 1 tablet (80 mg total) by mouth daily.   cholecalciferol (VITAMIN D3) 25 MCG (1000 UNIT) tablet Take 1,000 Units by mouth daily.   isosorbide mononitrate (IMDUR) 30 MG 24 hr tablet Take 1 tablet (30 mg total) by mouth daily.   nitroGLYCERIN (NITROSTAT) 0.4 MG SL tablet Place 1 tablet (0.4 mg total) under the tongue every 5 (five) minutes x 3 doses as needed for chest pain.   Omega-3 1000 MG CAPS Take 1 capsule by mouth daily.   therapeutic multivitamin-minerals (THERAGRAN-M) tablet Take 1 tablet by mouth daily.   ticagrelor (BRILINTA) 90 MG TABS tablet Take 1 tablet (90 mg total) by mouth 2 (two) times daily.   [DISCONTINUED] metoprolol tartrate (LOPRESSOR) 25 MG tablet Take 0.5 tablets (12.5 mg total) by mouth in the morning.     Allergies:   Patient has no known allergies.   Social History   Socioeconomic History   Marital status: Married    Spouse name: Not on file   Number of children: Not on file   Years of education: Not on file   Highest education level: Not on file  Occupational History   Not on file  Tobacco Use   Smoking status: Never   Smokeless tobacco: Never  Vaping Use   Vaping Use: Never used  Substance and Sexual Activity   Alcohol use: Yes    Comment: few tine time s a year  Drug use: Never   Sexual activity: Not on file  Other Topics Concern   Not on file  Social History Narrative   Not on file   Social Determinants of Health   Financial Resource Strain: Not on file  Food Insecurity: Not on file  Transportation Needs: Not on file  Physical Activity: Not on file  Stress: Not on file  Social Connections: Not on file     Family History: The patient's family history is negative for Heart attack and Heart disease.    ROS:   Please see the history of present illness.    All other systems reviewed and are  negative.   EKGs/Labs/Other Studies Reviewed:    The following studies were reviewed today:  2D echocardiogram (09/04/2021)   IMPRESSIONS     1. Left ventricular ejection fraction, by estimation, is 55 to 60%. The  left ventricle has normal function. The left ventricle has no regional  wall motion abnormalities. Left ventricular diastolic parameters are  consistent with Grade I diastolic  dysfunction (impaired relaxation).   2. Right ventricular systolic function is normal. The right ventricular  size is normal. There is normal pulmonary artery systolic pressure.   3. The mitral valve is normal in structure. Trivial mitral valve  regurgitation. No evidence of mitral stenosis.   4. The aortic valve is tricuspid. Aortic valve regurgitation is trivial.  No aortic stenosis is present.   5. The inferior vena cava is normal in size with greater than 50%  respiratory variability, suggesting right atrial pressure of 3 mmHg.    Cardiac catheterization/PCI and drug-eluting stent (09/05/2019)   Conclusion       Prox LAD to Mid LAD lesion is 75% stenosed.   Post intervention, there is a 0% residual stenosis.   1.  Total occlusion of the mid circumflex, treated with PCI using a 2.5 x 15 mm Onyx frontier DES 2.  Severe stenosis of the proximal LAD, treated with PCI using a 3.5 x 26 mm Onyx frontier DES 3.  Severe stenosis of the first diagonal branch of the LAD, treated with PCI using a 2.75 x 15 mm Onyx frontier DES 4.  Patent, dominant RCA with mild diffuse plaquing and no significant stenosis 5.  Normal LV systolic function with LVEF estimated at 55 to 65%  Recommendations: 2D echo, aggressive medical therapy with LDL goal less than 70 mg/dL, dual antiplatelet therapy with aspirin and Brilinta at least 12 months, as long as no complications arise the patient should be eligible for hospital discharge tomorrow.   Diagnostic Dominance: Right Intervention      EKG:  EKG is  ordered  today.  The ekg ordered today demonstrates sinus bradycardia at rate of 54 bpm  Recent Labs: 09/05/2021: Hemoglobin 14.2; Platelets 236 09/06/2021: BUN 16; Creatinine, Ser 1.29; Potassium 4.3; Sodium 136  Recent Lipid Panel    Component Value Date/Time   CHOL 191 09/05/2021 0240   TRIG 148 09/05/2021 0240   HDL 41 09/05/2021 0240   CHOLHDL 4.7 09/05/2021 0240   VLDL 30 09/05/2021 0240   LDLCALC 120 (H) 09/05/2021 0240     Physical Exam:    VS:  BP 124/90   Pulse (!) 54   Ht 6\' 2"  (1.88 m)   Wt 203 lb 3.2 oz (92.2 kg)   SpO2 97%   BMI 26.09 kg/m     Wt Readings from Last 3 Encounters:  09/26/21 203 lb 3.2 oz (92.2 kg)  09/05/21 212 lb (96.2  kg)     GEN:  Well nourished, well developed in no acute distress HEENT: Normal NECK: No JVD; No carotid bruits LYMPHATICS: No lymphadenopathy CARDIAC: RRR, no murmurs, rubs, gallops RESPIRATORY:  Clear to auscultation without rales, wheezing or rhonchi  ABDOMEN: Soft, non-tender, non-distended MUSCULOSKELETAL:  No edema; No deformity  SKIN: Warm and dry NEUROLOGIC:  Alert and oriented x 3 PSYCHIATRIC:  Normal affect   ASSESSMENT AND PLAN:    CAD s/p DES to Cx, LAD and diagonal -His current chest discomfort is different than his presenting symptoms.  Occurs with and without exertion.  He does not thinks this is due to GERD. -We will stop beta-blocker secondary to bradycardia/dizziness -Add Imdur 30 mg daily -Continue aspirin, Brilinta and Lipitor  2. HLD -09/05/2021: Cholesterol 191; HDL 41; LDL Cholesterol 120; Triglycerides 148; VLDL 30  -Continue Lipitor 80 -Repeat labs at follow-up  3 bradycardia/dizziness -Improved after reducing metoprolol tartrate to once daily.  Will stop and see response.  Patient is a Midwife.  I do not think his symptoms are due to angina.  Trial of Imdur.  Return desk duty until next office visit.  Medication Adjustments/Labs and Tests Ordered: Current medicines are reviewed at length  with the patient today.  Concerns regarding medicines are outlined above.  Orders Placed This Encounter  Procedures   EKG 12-Lead    Meds ordered this encounter  Medications   isosorbide mononitrate (IMDUR) 30 MG 24 hr tablet    Sig: Take 1 tablet (30 mg total) by mouth daily.    Dispense:  90 tablet    Refill:  3     Patient Instructions  Medication Instructions:  Your physician has recommended you make the following change in your medication:   STOP Metoprolol  START Imdur 30 mg taking 1 daily    *If you need a refill on your cardiac medications before your next appointment, please call your pharmacy*   Lab Work: None ordered  If you have labs (blood work) drawn today and your tests are completely normal, you will receive your results only by: MyChart Message (if you have MyChart) OR A paper copy in the mail If you have any lab test that is abnormal or we need to change your treatment, we will call you to review the results.   Testing/Procedures: None ordered   Follow-Up: At [facility], you and your health needs are our priority.  As part of our continuing mission to provide you with exceptional heart care, we have created designated Provider Care Teams.  These Care Teams include your primary Cardiologist (physician) and Advanced Practice Providers (APPs -  Physician Assistants and Nurse Practitioners) who all work together to provide you with the care you need, when you need it.  We recommend signing up for the patient portal called "MyChart".  Sign up information is provided on this After Visit Summary.  MyChart is used to connect with patients for Virtual Visits (Telemedicine).  Patients are able to view lab/test results, encounter notes, upcoming appointments, etc.  Non-urgent messages can be sent to your provider as well.   To learn more about what you can do with MyChart, go to [email or web address].    Your next appointment:   10/22/2021  ARRIVE AT  8:30  The format for your next appointment:   In Person  Provider:   Ronie Spies, PA-C   Other Instructions    Signed, Manson Passey, PA  09/26/2021 3:22 PM    [facility]  [facility]  

## 2021-09-27 ENCOUNTER — Telehealth: Payer: Self-pay | Admitting: Cardiovascular Disease

## 2021-09-27 NOTE — Telephone Encounter (Signed)
Returned call to patient who states he took his first dose of isosorbide 30 mg this morning and he feels a little lightheaded and has low BP. He states he has rechecked BP multiple times and it remains low (100/60). Prior to today, BP usually 120/60. He took last dose of metoprolol tartrate on 10/6. I advised that he may liberalize salt intake over the next few days in order to slightly increase BP and that he should continue to monitor. Advised it may take a few days for the metoprolol to fully wash out. He asks about a reduced dose and I advised that he may try taking 15 mg for a few days to see if his symptoms improve. He states he has been experiencing "twinges" in his chest and this was why [employer], PA started the isosorbide. Advised him to call back next week if BP and lightheadedness do not improve. He verbalized understanding and agreement with plan and thanked me for the call.

## 2021-09-27 NOTE — Telephone Encounter (Signed)
Pt c/o BP issue: STAT if pt c/o blurred vision, one-sided weakness or slurred speech  1. What are your last 5 BP readings? This morning 107/72, 105/74, now 105/68  2. Are you having any other symptoms (ex. Dizziness, headache, blurred vision, passed out)? Lightheaded, uses glasses so not sure if his eyes are blurred  3. What is your BP issue? Patient states his BP has been low today. He says he has also been a little lightheaded.

## 2021-09-27 NOTE — Telephone Encounter (Signed)
Would stop isosorbide. thanks

## 2021-09-27 NOTE — Telephone Encounter (Signed)
Called patient and advised him that per Dr. Excell Seltzer, he may d/c isosorbide. Patient was apprehensive about whether he should continue it or discontinue. He reports the first dose today made him feel very lightheaded and he has continued to have short episodes of chest discomfort today. I advised him to keep Isosorbide and SL NTG on hand and to monitor his symptoms in relation to exertion and rest. He verbalized agreement to d/c and continue to monitor. I advised him to call back with additional questions or concerns and he thanked me for the call.

## 2021-10-18 ENCOUNTER — Encounter: Payer: Self-pay | Admitting: Physician Assistant

## 2021-10-18 NOTE — Progress Notes (Deleted)
Cardiology Office Note    Date:  10/18/2021   ID:  Donald Jimenez, DOB Jul 20, 1961, MRN [id number]  PCP:  Joycelyn Rua, MD  Cardiologist:  Tonny Bollman, MD  Electrophysiologist:  None   Chief Complaint: ***  History of Present Illness:   Donald Jimenez is a 60 y.o. male with history of recently diagnosed CAD/STEMI (s/p DES to totally occluded LCx, also DES to prox LAD and D1), normal EF, HLD, CKD stage II by labs who presents for follow-up. Recent admission 08/2021 for MI reviewed with PCI as mentioned. 2D Echo 09/04/21 EF 55-60%, grade 1 DD, no RWMA. He had dizziness and bradycardia post-hospitalization requiring cessation of metoprolol. At f/u OV 09/26/21 he was describing some residual chest discomfort felt less likely anginal, but trial of Imdur was added. ***  CMET/lipids today Tsh   CAD with recent residual chest discomfort Hyperlipidemia CKD stage II by labs  Labwork independently reviewed: 08/2021 K 4.3, Cr 1.29, Hgb 14.2, plt 236, LDL 120   Past Medical History:  Diagnosis Date   CAD (coronary artery disease), native coronary artery 09/04/2021   s/p DES to Cx, LAD and diagonal   HLD (hyperlipidemia)     Past Surgical History:  Procedure Laterality Date   CORONARY/GRAFT ACUTE MI REVASCULARIZATION N/A 09/04/2021   Procedure: Coronary/Graft Acute MI Revascularization;  Surgeon: Tonny Bollman, MD;  Location: [facility] INVASIVE CV LAB;  Service: Cardiovascular;  Laterality: N/A;   LEFT HEART CATH AND CORONARY ANGIOGRAPHY N/A 09/04/2021   Procedure: LEFT HEART CATH AND CORONARY ANGIOGRAPHY;  Surgeon: Tonny Bollman, MD;  Location: [facility] INVASIVE CV LAB;  Service: Cardiovascular;  Laterality: N/A;    Current Medications: No outpatient medications have been marked as taking for the 10/22/21 encounter (Appointment) with Laurann Montana, PA-C.   ***   Allergies:   Patient has no known allergies.   Social History   Socioeconomic History   Marital status: Married    Spouse name:  Not on file   Number of children: Not on file   Years of education: Not on file   Highest education level: Not on file  Occupational History   Not on file  Tobacco Use   Smoking status: Never   Smokeless tobacco: Never  Vaping Use   Vaping Use: Never used  Substance and Sexual Activity   Alcohol use: Yes    Comment: few tine time s a year   Drug use: Never   Sexual activity: Not on file  Other Topics Concern   Not on file  Social History Narrative   Not on file   Social Determinants of Health   Financial Resource Strain: Not on file  Food Insecurity: Not on file  Transportation Needs: Not on file  Physical Activity: Not on file  Stress: Not on file  Social Connections: Not on file     Family History:  The patient's ***family history is negative for Heart attack and Heart disease.  ROS:   Please see the history of present illness. Otherwise, review of systems is positive for ***.  All other systems are reviewed and otherwise negative.    EKGs/Labs/Other Studies Reviewed:    Studies reviewed are outlined and summarized above. Reports included below if pertinent.  2D echo 09/04/21    1. Left ventricular ejection fraction, by estimation, is 55 to 60%. The  left ventricle has normal function. The left ventricle has no regional  wall motion abnormalities. Left ventricular diastolic parameters are  consistent with Grade I diastolic  dysfunction (impaired relaxation).   2. Right ventricular systolic function is normal. The right ventricular  size is normal. There is normal pulmonary artery systolic pressure.   3. The mitral valve is normal in structure. Trivial mitral valve  regurgitation. No evidence of mitral stenosis.   4. The aortic valve is tricuspid. Aortic valve regurgitation is trivial.  No aortic stenosis is present.   5. The inferior vena cava is normal in size with greater than 50%  respiratory variability, suggesting right atrial pressure of 3 mmHg.    Cardiac Cath 09/04/21   Prox LAD to Mid LAD lesion is 75% stenosed.   Post intervention, there is a 0% residual stenosis.   1.  Total occlusion of the mid circumflex, treated with PCI using a 2.5 x 15 mm Onyx frontier DES 2.  Severe stenosis of the proximal LAD, treated with PCI using a 3.5 x 26 mm Onyx frontier DES 3.  Severe stenosis of the first diagonal branch of the LAD, treated with PCI using a 2.75 x 15 mm Onyx frontier DES 4.  Patent, dominant RCA with mild diffuse plaquing and no significant stenosis 5.  Normal LV systolic function with LVEF estimated at 55 to 65%  Recommendations: 2D echo, aggressive medical therapy with LDL goal less than 70 mg/dL, dual antiplatelet therapy with aspirin and Brilinta at least 12 months, as long as no complications arise the patient should be eligible for hospital discharge tomorrow.      EKG:  EKG is ordered today, personally reviewed, demonstrating ***  Recent Labs: 09/05/2021: Hemoglobin 14.2; Platelets 236 09/06/2021: BUN 16; Creatinine, Ser 1.29; Potassium 4.3; Sodium 136  Recent Lipid Panel    Component Value Date/Time   CHOL 191 09/05/2021 0240   TRIG 148 09/05/2021 0240   HDL 41 09/05/2021 0240   CHOLHDL 4.7 09/05/2021 0240   VLDL 30 09/05/2021 0240   LDLCALC 120 (H) 09/05/2021 0240    PHYSICAL EXAM:    VS:  There were no vitals taken for this visit.  BMI: There is no height or weight on file to calculate BMI.  GEN: Well nourished, well developed male in no acute distress HEENT: normocephalic, atraumatic Neck: no JVD, carotid bruits, or masses Cardiac: ***RRR; no murmurs, rubs, or gallops, no edema  Respiratory:  clear to auscultation bilaterally, normal work of breathing GI: soft, nontender, nondistended, + BS MS: no deformity or atrophy Skin: warm and dry, no rash Neuro:  Alert and Oriented x 3, Strength and sensation are intact, follows commands Psych: euthymic mood, full affect  Wt Readings from Last 3 Encounters:   09/26/21 203 lb 3.2 oz (92.2 kg)  09/05/21 212 lb (96.2 kg)     ASSESSMENT & PLAN:   ***  {The patient has an active order for outpatient cardiac rehabilitation.   Please indicate if the patient is ready to start. Do NOT delete this.  It will auto delete.  Refresh note, then sign.              Click here to document readiness and see contraindications.  :1}  Cardiac Rehabilitation Eligibility Assessment      Disposition: F/u with ***   Medication Adjustments/Labs and Tests Ordered: Current medicines are reviewed at length with the patient today.  Concerns regarding medicines are outlined above. Medication changes, Labs and Tests ordered today are summarized above and listed in the Patient Instructions accessible in Encounters.   Signed, Laurann Montana, PA-C  10/18/2021 12:44 PM    [facility] [address], [place], [place]  [address] Phone: [phone]; Fax: [phone]

## 2021-10-22 ENCOUNTER — Ambulatory Visit: Payer: 59 | Admitting: Physician Assistant

## 2021-10-22 DIAGNOSIS — I2510 Atherosclerotic heart disease of native coronary artery without angina pectoris: Secondary | ICD-10-CM

## 2021-10-22 DIAGNOSIS — E785 Hyperlipidemia, unspecified: Secondary | ICD-10-CM

## 2021-10-22 DIAGNOSIS — N182 Chronic kidney disease, stage 2 (mild): Secondary | ICD-10-CM

## 2021-10-22 DIAGNOSIS — R0789 Other chest pain: Secondary | ICD-10-CM

## 2021-10-22 NOTE — Progress Notes (Signed)
Cardiology Office Note    Date:  10/23/2021   ID:  Donald Jimenez, DOB [birth year], MRN [id number]  PCP:  Orpah Melter, MD  Cardiologist:  Sherren Mocha, MD  Electrophysiologist:  None   Chief Complaint: f/u CAD, CP  History of Present Illness:   Donald Jimenez is a 60 y.o. male with history of recently diagnosed CAD/STEMI (s/p DES to totally occluded LCx, also DES to prox LAD and D1), normal EF, HLD, CKD stage II by labs who presents for follow-up. Recent admission 08/2021 for MI reviewed with PCI as mentioned. 2D Echo 09/04/21 EF 55-60%, grade 1 DD, no RWMA. He had dizziness and bradycardia post-hospitalization requiring cessation of metoprolol. At f/u OV 09/26/21 he was describing some residual chest discomfort which was not felt to be anginal, but trial of Imdur was added. However, he did not tolerate this as he felt it dropped his BP too much to 110/60 and did not feel well with this.  He returns for follow-up today doing fine. For the last 2 weeks he has not had any recurrent chest pain. BP running above goal today. He reports history of borderline HTN with majority of home BPs running mid 130s and above. 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    Labwork independently reviewed:  08/2021 K 4.3, Cr 1.29, Hgb 14.2, plt 236, LDL 120, no prior TSH or LFTs on file   Past Medical History:  Diagnosis Date   CAD (coronary artery disease), native coronary artery 09/04/2021   s/p DES to Cx, LAD and diagonal   Chronic kidney disease, stage 2 (mild)    Essential hypertension    HLD (hyperlipidemia)     Past Surgical History:  Procedure Laterality Date   CORONARY/GRAFT ACUTE MI REVASCULARIZATION N/A 09/04/2021   Procedure: Coronary/Graft Acute MI Revascularization;  Surgeon: Sherren Mocha, MD;  Location: [facility];  Service: Cardiovascular;  Laterality: N/A;   LEFT HEART CATH AND CORONARY ANGIOGRAPHY N/A 09/04/2021   Procedure: LEFT HEART CATH AND  CORONARY ANGIOGRAPHY;  Surgeon: Sherren Mocha, MD;  Location: [facility];  Service: Cardiovascular;  Laterality: N/A;    Current Medications: Current Meds  Medication Sig   amLODipine (NORVASC) 5 MG tablet Take as directed   Ascorbic Acid (VITAMIN C) 100 MG tablet Take 100 mg by mouth daily.   aspirin 81 MG EC tablet Take 1 tablet (81 mg total) by mouth daily. Swallow whole.   atorvastatin (LIPITOR) 80 MG tablet Take 1 tablet (80 mg total) by mouth daily.   cholecalciferol (VITAMIN D3) 25 MCG (1000 UNIT) tablet Take 1,000 Units by mouth daily.   nitroGLYCERIN (NITROSTAT) 0.4 MG SL tablet Place 1 tablet (0.4 mg total) under the tongue every 5 (five) minutes x 3 doses as needed for chest pain.   Omega-3 1000 MG CAPS Take 1 capsule by mouth daily.   therapeutic multivitamin-minerals (THERAGRAN-M) tablet Take 1 tablet by mouth daily.   ticagrelor (BRILINTA) 90 MG TABS tablet Take 1 tablet (90 mg total) by mouth 2 (two) times daily.     Allergies:   Patient has no known allergies.   Social History   Socioeconomic History   Marital status: Married    Spouse name: Not on file   Number of children: Not on file   Years of education: Not on file   Highest education level: Not on file  Occupational History   Not on file  Tobacco Use   Smoking status:  Never   Smokeless tobacco: Never  Vaping Use   Vaping Use: Never used  Substance and Sexual Activity   Alcohol use: Yes    Comment: few tine time s a year   Drug use: Never   Sexual activity: Not on file  Other Topics Concern   Not on file  Social History Narrative   Not on file   Social Determinants of Health   Financial Resource Strain: Not on file  Food Insecurity: Not on file  Transportation Needs: Not on file  Physical Activity: Not on file  Stress: Not on file  Social Connections: Not on file     Family History:  The patient's family history is negative for Heart attack and Heart disease.  ROS:   Please see  the history of present illness.  All other systems are reviewed and otherwise negative.    EKGs/Labs/Other Studies Reviewed:    Studies reviewed are outlined and summarized above. Reports included below if pertinent.  2D echo 09/04/21     1. Left ventricular ejection fraction, by estimation, is 55 to 60%. The  left ventricle has normal function. The left ventricle has no regional  wall motion abnormalities. Left ventricular diastolic parameters are  consistent with Grade I diastolic  dysfunction (impaired relaxation).   2. Right ventricular systolic function is normal. The right ventricular  size is normal. There is normal pulmonary artery systolic pressure.   3. The mitral valve is normal in structure. Trivial mitral valve  regurgitation. No evidence of mitral stenosis.   4. The aortic valve is tricuspid. Aortic valve regurgitation is trivial.  No aortic stenosis is present.   5. The inferior vena cava is normal in size with greater than 50%  respiratory variability, suggesting right atrial pressure of 3 mmHg.   Cardiac Cath 09/04/21    Prox LAD to Mid LAD lesion is 75% stenosed.    Post intervention, there is a 0% residual stenosis.     1.  Total occlusion of the mid circumflex, treated with PCI using a 2.5 x 15 mm Onyx frontier DES  2.  Severe stenosis of the proximal LAD, treated with PCI using a 3.5 x 26 mm Onyx frontier DES  3.  Severe stenosis of the first diagonal branch of the LAD, treated with PCI using a 2.75 x 15 mm Onyx frontier DES  4.  Patent, dominant RCA with mild diffuse plaquing and no significant stenosis  5.  Normal LV systolic function with LVEF estimated at 55 to 65%     EKG:  EKG is not ordered today  Recent Labs: 09/05/2021: Hemoglobin 14.2; Platelets 236 09/06/2021: BUN 16; Creatinine, Ser 1.29; Potassium 4.3; Sodium 136  Recent Lipid Panel    Component Value Date/Time   CHOL 191 09/05/2021 0240   TRIG 148 09/05/2021 0240   HDL 41 09/05/2021 0240    CHOLHDL 4.7 09/05/2021 0240   VLDL 30 09/05/2021 0240   LDLCALC 120 (H) 09/05/2021 0240    PHYSICAL EXAM:    VS:  BP (!) 142/86   Pulse 64   Ht 6\' 2"  (1.88 m)   Wt 203 lb 12.8 oz (92.4 kg)   SpO2 98%   BMI 26.17 kg/m   BMI: Body mass index is 26.17 kg/m.  GEN: Well nourished, well developed male in no acute distress HEENT: normocephalic, atraumatic Neck: no JVD, carotid bruits, or masses Cardiac: RRR; no murmurs, rubs, or gallops, no edema  Respiratory:  clear to auscultation bilaterally, normal work  of breathing GI: soft, nontender, nondistended, + BS MS: no deformity or atrophy Skin: warm and dry, no rash Neuro:  Alert and Oriented x 3, Strength and sensation are intact, follows commands Psych: euthymic mood, full affect  Wt Readings from Last 3 Encounters:  10/23/21 203 lb 12.8 oz (92.4 kg)  09/26/21 203 lb 3.2 oz (92.2 kg)  09/05/21 212 lb (96.2 kg)     ASSESSMENT & PLAN:   1. CAD with recent residual chest discomfort - symptoms have resolved and he is feeling better. He has been without chest pain for 2 weeks now. OK to return to work at this time - will give work note. Continue ASA, Brilinta, atorvastatin. Not on BB due to hx of bradycardia/dizziness. He is also interested in cardiac rehab, awaiting their call - cleared to start from my perspective.  2. Hyperlipidemia - check CMET and lipid profile with direct LDL today. (Had protein shake.) Continue atorvastatin - tolerating well.  3. CKD stage II by labs - discussed finding with patient. Discussed recommendation for BP control and avoidance of NSAIDS.  4. Essential HTN - Suboptimal blood pressure control noted today. We will add amlodipine - will rx 5mg  tablet and ask him to start 1/2 tablet daily. If BP is controlled after 3-4 days he may remain at this dose but if his SBP is >130 he was given instructions to increase to 1 whole tablet daily. Instructions provided to relay BP results to our office in 1 week. Check  baseline TSH with labs.     Cardiac Rehabilitation Eligibility Assessment  The patient is ready to start cardiac rehabilitation from a cardiac standpoint.    Disposition: F/u with Dr. in 6 months.   Medication Adjustments/Labs and Tests Ordered: Current medicines are reviewed at length with the patient today.  Concerns regarding medicines are outlined above. Medication changes, Labs and Tests ordered today are summarized above and listed in the Patient Instructions accessible in Encounters.   Signed, Excell Seltzer, PA-C  10/23/2021 10:09 AM    [facility] [address] [place], [place], [place]  [place] Phone: [phone]; Fax: [phone]

## 2021-10-23 ENCOUNTER — Encounter: Payer: Self-pay | Admitting: Physician Assistant

## 2021-10-23 ENCOUNTER — Other Ambulatory Visit: Payer: Self-pay

## 2021-10-23 ENCOUNTER — Ambulatory Visit: Payer: 59 | Admitting: Physician Assistant

## 2021-10-23 ENCOUNTER — Encounter: Payer: Self-pay | Admitting: *Deleted

## 2021-10-23 VITALS — BP 142/86 | HR 64 | Ht 74.0 in | Wt 203.8 lb

## 2021-10-23 DIAGNOSIS — E785 Hyperlipidemia, unspecified: Secondary | ICD-10-CM | POA: Diagnosis not present

## 2021-10-23 DIAGNOSIS — I2510 Atherosclerotic heart disease of native coronary artery without angina pectoris: Secondary | ICD-10-CM

## 2021-10-23 DIAGNOSIS — N182 Chronic kidney disease, stage 2 (mild): Secondary | ICD-10-CM | POA: Diagnosis not present

## 2021-10-23 DIAGNOSIS — I10 Essential (primary) hypertension: Secondary | ICD-10-CM

## 2021-10-23 DIAGNOSIS — R079 Chest pain, unspecified: Secondary | ICD-10-CM | POA: Diagnosis not present

## 2021-10-23 LAB — LIPID PANEL
Chol/HDL Ratio: 2.8 ratio (ref 0.0–5.0)
Cholesterol, Total: 109 mg/dL (ref 100–199)
HDL: 39 mg/dL — ABNORMAL LOW (ref >39)
LDL Chol Calc (NIH): 45 mg/dL (ref 0–99)
Triglycerides: 146 mg/dL (ref 0–149)
VLDL Cholesterol Cal: 25 mg/dL (ref 5–40)

## 2021-10-23 LAB — COMPREHENSIVE METABOLIC PANEL
ALT: 20 IU/L (ref 0–44)
AST: 19 IU/L (ref 0–40)
Albumin/Globulin Ratio: 1.8 (ref 1.2–2.2)
Albumin: 4.4 g/dL (ref 3.8–4.9)
Alkaline Phosphatase: 90 IU/L (ref 44–121)
BUN/Creatinine Ratio: 17 (ref 10–24)
BUN: 19 mg/dL (ref 8–27)
Bilirubin Total: 0.9 mg/dL (ref 0.0–1.2)
CO2: 25 mmol/L (ref 20–29)
Calcium: 10 mg/dL (ref 8.6–10.2)
Chloride: 102 mmol/L (ref 96–106)
Creatinine, Ser: 1.15 mg/dL (ref 0.76–1.27)
Globulin, Total: 2.4 g/dL (ref 1.5–4.5)
Glucose: 80 mg/dL (ref 70–99)
Potassium: 5.1 mmol/L (ref 3.5–5.2)
Sodium: 138 mmol/L (ref 134–144)
Total Protein: 6.8 g/dL (ref 6.0–8.5)
eGFR: 73 mL/min/{1.73_m2} (ref >59)

## 2021-10-23 LAB — LDL CHOLESTEROL, DIRECT: LDL Direct: 45 mg/dL (ref 0–99)

## 2021-10-23 LAB — TSH: TSH: 1.68 u[IU]/mL (ref 0.450–4.500)

## 2021-10-23 MED ORDER — AMLODIPINE BESYLATE 5 MG PO TABS: ORAL_TABLET | ORAL | 3 refills | Status: DC

## 2021-10-23 NOTE — Patient Instructions (Signed)
Medication Instructions:  Your physician has recommended you make the following change in your medication:   START Amlodpine 5 mg. Take as directed at the bottom of page  *If you need a refill on your cardiac medications before your next appointment, please call your pharmacy*   Lab Work: TODAY:  CMET, LIPID, DIRECT LDL, & TSH  If you have labs (blood work) drawn today and your tests are completely normal, you will receive your results only by: MyChart Message (if you have MyChart) OR A paper copy in the mail If you have any lab test that is abnormal or we need to change your treatment, we will call you to review the results.   Testing/Procedures: None ordere   Follow-Up: At [facility], you and your health needs are our priority.  As part of our continuing mission to provide you with exceptional heart care, we have created designated Provider Care Teams.  These Care Teams include your primary Cardiologist (physician) and Advanced Practice Providers (APPs -  Physician Assistants and Nurse Practitioners) who all work together to provide you with the care you need, when you need it.  We recommend signing up for the patient portal called "MyChart".  Sign up information is provided on this After Visit Summary.  MyChart is used to connect with patients for Virtual Visits (Telemedicine).  Patients are able to view lab/test results, encounter notes, upcoming appointments, etc.  Non-urgent messages can be sent to your provider as well.   To learn more about what you can do with MyChart, go to [email or web address].    Your next appointment:   6 month(s)  The format for your next appointment:   In Person  Provider:   Tonny Bollman, MD or Ronie Spies, PA-C   Other Instructions You may start amlodipine by taking 1/2 tablet (2.5mg ) daily and follow your blood pressure. If this starting dose lowers your blood pressure to less than 130 on the top number within 3-4 days, you may stay at  the lower dose - just let us know. However, if your blood pressure continues to run over 130 on the top number after 3-4 days, please increase to 1 whole tablet (5mg ) daily.  To check your blood pressure, here are some instructions to follow:  - I would recommend using a blood pressure cuff that goes on your arm. The wrist ones can be inaccurate. If you're purchasing one for the first time, try to select one that also reports your heart rate because this can be helpful information as well. - To check your blood pressure, choose a time at least 3 hours after taking your blood pressure medicines. If you can sample it at different times of the day, that's great - it might give you more information about how your blood pressure fluctuates. Remain seated in a chair for 5 minutes quietly beforehand, then check it.  - Please record a list of those readings and call us/send in MyChart message with them for our review in 1 week.

## 2021-10-24 NOTE — Telephone Encounter (Signed)
FYI in case pharmacy calls, see msg and response below. Will hold off sending updated more specific rx pending his BP readings.

## 2021-10-24 NOTE — Progress Notes (Signed)
Pt has been made aware of normal result and verbalized understanding.  jw

## 2021-11-19 ENCOUNTER — Telehealth (HOSPITAL_COMMUNITY): Payer: Self-pay

## 2021-11-19 NOTE — Telephone Encounter (Signed)
Called and spoke with pt in regards to CR, pt stated he is not interested at this time.   Closed referral 

## 2021-11-25 ENCOUNTER — Other Ambulatory Visit: Payer: Self-pay

## 2021-11-25 ENCOUNTER — Encounter (HOSPITAL_COMMUNITY): Payer: Self-pay | Admitting: Emergency Medicine

## 2021-11-25 ENCOUNTER — Emergency Department (HOSPITAL_COMMUNITY): Payer: 59

## 2021-11-25 ENCOUNTER — Emergency Department (HOSPITAL_COMMUNITY)
Admission: EM | Admit: 2021-11-25 | Discharge: 2021-11-26 | Disposition: A | Discharge status: Home / Self Care | Payer: 59 | Attending: Emergency Medicine | Admitting: Emergency Medicine

## 2021-11-25 DIAGNOSIS — I2510 Atherosclerotic heart disease of native coronary artery without angina pectoris: Secondary | ICD-10-CM | POA: Diagnosis not present

## 2021-11-25 DIAGNOSIS — R0789 Other chest pain: Secondary | ICD-10-CM | POA: Diagnosis present

## 2021-11-25 DIAGNOSIS — I129 Hypertensive chronic kidney disease with stage 1 through stage 4 chronic kidney disease, or unspecified chronic kidney disease: Secondary | ICD-10-CM | POA: Diagnosis not present

## 2021-11-25 DIAGNOSIS — Z7982 Long term (current) use of aspirin: Secondary | ICD-10-CM | POA: Insufficient documentation

## 2021-11-25 DIAGNOSIS — N182 Chronic kidney disease, stage 2 (mild): Secondary | ICD-10-CM | POA: Insufficient documentation

## 2021-11-25 DIAGNOSIS — Z79899 Other long term (current) drug therapy: Secondary | ICD-10-CM | POA: Diagnosis not present

## 2021-11-25 DIAGNOSIS — R072 Precordial pain: Secondary | ICD-10-CM | POA: Diagnosis not present

## 2021-11-25 LAB — CBC WITH DIFFERENTIAL/PLATELET
Abs Immature Granulocytes: 0.02 10*3/uL (ref 0.00–0.07)
Basophils Absolute: 0.1 10*3/uL (ref 0.0–0.1)
Basophils Relative: 1 %
Eosinophils Absolute: 0.2 10*3/uL (ref 0.0–0.5)
Eosinophils Relative: 2 %
HCT: 42.9 % (ref 39.0–52.0)
Hemoglobin: 14.3 g/dL (ref 13.0–17.0)
Immature Granulocytes: 0 %
Lymphocytes Relative: 31 %
Lymphs Abs: 2.9 10*3/uL (ref 0.7–4.0)
MCH: 30.2 pg (ref 26.0–34.0)
MCHC: 33.3 g/dL (ref 30.0–36.0)
MCV: 90.7 fL (ref 80.0–100.0)
Monocytes Absolute: 1 10*3/uL (ref 0.1–1.0)
Monocytes Relative: 10 %
Neutro Abs: 5.3 10*3/uL (ref 1.7–7.7)
Neutrophils Relative %: 56 %
Platelets: 259 10*3/uL (ref 150–400)
RBC: 4.73 MIL/uL (ref 4.22–5.81)
RDW: 12 % (ref 11.5–15.5)
WBC: 9.4 10*3/uL (ref 4.0–10.5)
nRBC: 0 % (ref 0.0–0.2)

## 2021-11-25 NOTE — ED Triage Notes (Addendum)
Pt presents to ED BIB [facility] from home. Pt c/o center CP. Describes as dull and rates pain 3/10 and similar to STEMI 53m ago, ago. Pt took 324 asa, and nitro x1. EMS gave nitro x1, relieved pain.  170/100, 114/72 after nitro HR - 70 99% RA 18G LAC

## 2021-11-25 NOTE — ED Provider Notes (Signed)
Emergency Medicine Provider Triage Evaluation Note  Eric Nees , a 60 y.o. male  was evaluated in triage.  Pt complains of chest discomfort that began @1500  today. Central nonradiating pressure, no aggravating factors, alleviated with NTG. Received ASA prior to arrival. No pain @ present. Somewhat similar discomfort with recent admission, but today was less severe.   Review of Systems  Positive: Chest pain Negative: N/V, diaphoresis, syncope, dyspnea  Physical Exam  There were no vitals taken for this visit. Gen:   Awake, no distress   Resp:  Normal effort  MSK:   Moves extremities without difficulty  Other:  Heart RRR, 2+ symmetric pulses.   Medical Decision Making  Medically screening exam initiated at 10:36 PM.  Appropriate orders placed.  was informed that the remainder of the evaluation will be completed by another provider, this initial triage assessment does not replace that evaluation, and the importance of remaining in the ED until their evaluation is complete.  Chest pain.  No pain @ present.  Heart cath 08/2021, required intervention at that time.    09/2021 11/25/21 2251    14/05/22, MD 11/25/21 2326

## 2021-11-26 ENCOUNTER — Encounter: Payer: Self-pay | Admitting: Cardiovascular Disease

## 2021-11-26 LAB — TROPONIN I (HIGH SENSITIVITY)
Troponin I (High Sensitivity): 3 ng/L (ref <18)
Troponin I (High Sensitivity): 3 ng/L (ref <18)

## 2021-11-26 LAB — COMPREHENSIVE METABOLIC PANEL
ALT: 25 U/L (ref 0–44)
AST: 23 U/L (ref 15–41)
Albumin: 3.7 g/dL (ref 3.5–5.0)
Alkaline Phosphatase: 67 U/L (ref 38–126)
Anion gap: 9 (ref 5–15)
BUN: 16 mg/dL (ref 6–20)
CO2: 25 mmol/L (ref 22–32)
Calcium: 9.5 mg/dL (ref 8.9–10.3)
Chloride: 103 mmol/L (ref 98–111)
Creatinine, Ser: 1.15 mg/dL (ref 0.61–1.24)
GFR, Estimated: >60 mL/min (ref >60)
Glucose, Bld: 106 mg/dL — ABNORMAL HIGH (ref 70–99)
Potassium: 4 mmol/L (ref 3.5–5.1)
Sodium: 137 mmol/L (ref 135–145)
Total Bilirubin: 1.1 mg/dL (ref 0.3–1.2)
Total Protein: 6.5 g/dL (ref 6.5–8.1)

## 2021-11-26 NOTE — ED Provider Notes (Signed)
[facility] EMERGENCY DEPARTMENT Provider Note   CSN: [id number] Arrival date & time: 11/25/21  2228     History Chief Complaint  Patient presents with   Chest Pain    Donald Jimenez is a 60 y.o. male.  HPI     This is a 60 year old male with a history of coronary artery disease, chronic kidney disease, hypertension, hyperlipidemia who presents with chest pain.  Patient had an MI on 09/04/2021 with drug-eluting stent.  Since that time he had had some chest discomfort and was started on Imdur.  He did not tolerate Imdur.  He ultimately began to feel better with improvement of his chest discomfort.  He returned to work.  Yesterday evening he began to feel central nonradiating chest discomfort.  He did not feel that it was exertional in nature.  He took 1 nitroglycerin with some relief.  He was given a second nitroglycerin by EMS and had resolution of symptoms.  He also took an aspirin prior to arrival.  He is currently symptom-free.  He states that his symptoms are somewhat similar to prior MRI; however they are not as severe.  He denies shortness of breath.  No recent fevers or cough.  Radiology notes reviewed.  Confirm recent MI with recurrent chest pain that had resolved by the last outpatient cardiology evaluation.  Past Medical History:  Diagnosis Date   CAD (coronary artery disease), native coronary artery 09/04/2021   s/p DES to Cx, LAD and diagonal   Chronic kidney disease, stage 2 (mild)    Essential hypertension    HLD (hyperlipidemia)     Patient Active Problem List   Diagnosis Date Noted   Non-ST elevation (NSTEMI) myocardial infarction ([place]) 09/04/2021   NSTEMI (non-ST elevated myocardial infarction) ([place]) 09/04/2021    Past Surgical History:  Procedure Laterality Date   CORONARY/GRAFT ACUTE MI REVASCULARIZATION N/A 09/04/2021   Procedure: Coronary/Graft Acute MI Revascularization;  Surgeon: Sherren Mocha, MD;  Location: [facility];   Service: Cardiovascular;  Laterality: N/A;   LEFT HEART CATH AND CORONARY ANGIOGRAPHY N/A 09/04/2021   Procedure: LEFT HEART CATH AND CORONARY ANGIOGRAPHY;  Surgeon: Sherren Mocha, MD;  Location: [facility];  Service: Cardiovascular;  Laterality: N/A;       Family History  Problem Relation Age of Onset   Heart attack Neg Hx    Heart disease Neg Hx     Social History   Tobacco Use   Smoking status: Never   Smokeless tobacco: Never  Vaping Use   Vaping Use: Never used  Substance Use Topics   Alcohol use: Yes    Comment: few tine time s a year   Drug use: Never    Home Medications Prior to Admission medications   Medication Sig Start Date End Date Taking? Authorizing Provider  amLODipine (NORVASC) 5 MG tablet Take as directed 10/23/21   Charlie Pitter, PA-C  Ascorbic Acid (VITAMIN C) 100 MG tablet Take 100 mg by mouth daily.    [provider]  aspirin 81 MG EC tablet Take 1 tablet (81 mg total) by mouth daily. Swallow whole. 09/06/21   Sherren Mocha, MD  atorvastatin (LIPITOR) 80 MG tablet Take 1 tablet (80 mg total) by mouth daily. 09/06/21   Sherren Mocha, MD  cholecalciferol (VITAMIN D3) 25 MCG (1000 UNIT) tablet Take 1,000 Units by mouth daily.    [provider]  nitroGLYCERIN (NITROSTAT) 0.4 MG SL tablet Place 1 tablet (0.4 mg total) under the  tongue every 5 (five) minutes x 3 doses as needed for chest pain. 09/06/21   Tonny Bollman, MD  Omega-3 1000 MG CAPS Take 1 capsule by mouth daily.    [provider]  therapeutic multivitamin-minerals (THERAGRAN-M) tablet Take 1 tablet by mouth daily.    [provider]  ticagrelor (BRILINTA) 90 MG TABS tablet Take 1 tablet (90 mg total) by mouth 2 (two) times daily. 09/06/21   Tonny Bollman, MD    Allergies    Patient has no known allergies.  Review of Systems   Review of Systems  Constitutional:  Negative for fever.  Respiratory:  Negative for shortness of breath.    Cardiovascular:  Positive for chest pain. Negative for leg swelling.  Gastrointestinal:  Negative for abdominal pain, nausea and vomiting.  Genitourinary:  Negative for dysuria.  All other systems reviewed and are negative.  Physical Exam Updated Vital Signs BP 121/81   Pulse (!) 56   Temp 97.9 F (36.6 C) (Oral)   Resp 15   Ht 1.88 m (6\' 2" )   Wt 86.2 kg   SpO2 96%   BMI 24.39 kg/m   Physical Exam Vitals and nursing note reviewed.  Constitutional:      Appearance: He is well-developed. He is not ill-appearing.  HENT:     Head: Normocephalic and atraumatic.  Eyes:     Pupils: Pupils are equal, round, and reactive to light.  Cardiovascular:     Rate and Rhythm: Normal rate and regular rhythm.     Heart sounds: Normal heart sounds. No murmur heard. Pulmonary:     Effort: Pulmonary effort is normal. No respiratory distress.     Breath sounds: Normal breath sounds. No wheezing.  Abdominal:     General: Bowel sounds are normal.     Palpations: Abdomen is soft.     Tenderness: There is no abdominal tenderness. There is no rebound.  Musculoskeletal:     Cervical back: Neck supple.     Right lower leg: No tenderness. No edema.     Left lower leg: No tenderness. No edema.  Lymphadenopathy:     Cervical: No cervical adenopathy.  Skin:    General: Skin is warm and dry.  Neurological:     Mental Status: He is alert and oriented to person, place, and time.  Psychiatric:        Mood and Affect: Mood normal.    ED Results / Procedures / Treatments   Labs (all labs ordered are listed, but only abnormal results are displayed) Labs Reviewed  COMPREHENSIVE METABOLIC PANEL - Abnormal; Notable for the following components:      Result Value   Glucose, Bld 106 (*)    All other components within normal limits  CBC WITH DIFFERENTIAL/PLATELET  TROPONIN I (HIGH SENSITIVITY)  TROPONIN I (HIGH SENSITIVITY)    EKG EKG Interpretation  Date/Time:  Monday November 25 2021 22:41:47  EST Ventricular Rate:  71 PR Interval:  174 QRS Duration: 72 QT Interval:  380 QTC Calculation: 412 R Axis:   29 Text Interpretation: Normal sinus rhythm Normal ECG Confirmed by [id number] (Ross Marcus) on 11/26/2021 12:17:45 AM  Radiology DG Chest 2 View  Result Date: 11/25/2021 CLINICAL DATA:  Chest pain. EXAM: CHEST - 2 VIEW COMPARISON:  September 04, 2021 FINDINGS: The heart size and mediastinal contours are within normal limits. Coronary artery stents. No focal consolidation. No pleural effusion. No pneumothorax. The visualized skeletal structures are unremarkable. IMPRESSION: No active cardiopulmonary disease. Electronically Signed  By: Dahlia Bailiff M.D.   On: 11/25/2021 23:22    Procedures Procedures   Medications Ordered in ED Medications - No data to display  ED Course  I have reviewed the triage vital signs and the nursing notes.  Pertinent labs & imaging results that were available during my care of the patient were reviewed by me and considered in my medical decision making (see chart for details).  Clinical Course as of 11/26/21 0314  Tue Nov 26, 2021  0308 Spoke with Dr. Chancy Milroy, cardiology.  Reviewed work-up.  Very reassuring with negative troponins and normal EKG.  He recommends follow-up with cardiology as outpatient with potentially a second trial of Imdur.  Will defer to outpatient cardiology.  Has remained chest pain-free while in the emergency department. [CH]    Clinical Course User Index [CH] Dereon Williamsen, Barbette Hair, MD   MDM Rules/Calculators/A&P                           Patient presents with chest pain.  He is overall nontoxic and vital signs are reassuring.  Pain was pressure-like and improved with nitroglycerin which is somewhat concerning.  It was nonexertional in nature.  He has a significant history of ACS with recent stent placement in September.  EKG is normal without acute ischemic or arrhythmic changes.  Initial troponin negative.  Patient has remained  chest pain-free while in the emergency department.  Chest x-ray shows no evidence of pneumothorax or pneumonia.  Repeat troponin is stable and flat.  Low suspicion for PE.  See clinical course above.  While patient is high risk, his troponin profile and EKG are very reassuring today.  He has close follow-up with cardiology.  Patient and wife were reassured with work-up.  Given that he is remained chest pain-free, recommend he follow-up closely with cardiology as an outpatient.  They are agreeable to plan.  After history, exam, and medical workup I feel the patient has been appropriately medically screened and is safe for discharge home. Pertinent diagnoses were discussed with the patient. Patient was given return precautions.  Final Clinical Impression(s) / ED Diagnoses Final diagnoses:  Precordial pain    Rx / DC Orders ED Discharge Orders     None        Mykal Batiz, Barbette Hair, MD 11/26/21 [phone]

## 2021-11-26 NOTE — ED Notes (Signed)
Patient reports no current chest pain.  Made aware of plan of care and discharge instructions

## 2021-11-26 NOTE — Discharge Instructions (Signed)
You were seen today for chest pain.  Your cardiac work-up is reassuring today.  Given your history, it is important you follow-up with cardiology.  Call office later this morning for close follow-up for medication optimization.  If you have any new or worsening symptoms, you should be reevaluated immediately.

## 2021-11-26 NOTE — ED Notes (Signed)
Patient updated on plan of care.  Reports no current chest pain

## 2021-11-27 ENCOUNTER — Encounter: Payer: Self-pay | Admitting: Cardiovascular Disease

## 2021-11-27 ENCOUNTER — Ambulatory Visit (INDEPENDENT_AMBULATORY_CARE_PROVIDER_SITE_OTHER): Payer: 59 | Admitting: Cardiovascular Disease

## 2021-11-27 ENCOUNTER — Other Ambulatory Visit: Payer: Self-pay

## 2021-11-27 VITALS — BP 136/100 | HR 71 | Ht 74.0 in | Wt 211.8 lb

## 2021-11-27 DIAGNOSIS — R072 Precordial pain: Secondary | ICD-10-CM

## 2021-11-27 DIAGNOSIS — E782 Mixed hyperlipidemia: Secondary | ICD-10-CM | POA: Diagnosis not present

## 2021-11-27 DIAGNOSIS — I2510 Atherosclerotic heart disease of native coronary artery without angina pectoris: Secondary | ICD-10-CM

## 2021-11-27 DIAGNOSIS — I10 Essential (primary) hypertension: Secondary | ICD-10-CM

## 2021-11-27 NOTE — Patient Instructions (Signed)
Medication Instructions:  Your physician recommends that you continue on your current medications as directed. Please refer to the Current Medication list given to you today.  *If you need a refill on your cardiac medications before your next appointment, please call your pharmacy*  Testing/Procedures: Your physician has requested that you have an exercise tolerance test. For further information please visit [email or web address] Please also follow instruction sheet, as given.  Follow-Up: At [facility], you and your health needs are our priority.  As part of our continuing mission to provide you with exceptional heart care, we have created designated Provider Care Teams.  These Care Teams include your primary Cardiologist (physician) and Advanced Practice Providers (APPs -  Physician Assistants and Nurse Practitioners) who all work together to provide you with the care you need, when you need it.   Your next appointment:   May 2023  The format for your next appointment:   In Person  Provider:   Tonny Bollman, MD  or Ronie Spies, PA-C

## 2021-11-27 NOTE — Progress Notes (Signed)
Cardiology Office Note:    Date:  11/27/2021   ID:  Donald Jimenez, DOB December 16, 1961, MRN [id number]  PCP:  Orpah Melter, MD   [facility] Providers Cardiologist:  Sherren Mocha, MD     Referring MD: Orpah Melter, MD   Chief Complaint  Patient presents with   Chest Pain     History of Present Illness:    Donald Jimenez is a 60 y.o. male with a hx of STEMI involving the left circumflex in September 2022.  He underwent revascularization with PCI of the left circumflex, proximal LAD, and first diagonal branches.  He was found to have normal LV systolic function.  Comorbid conditions include hypertension and mixed hyperlipidemia.  The patient's initial hospitalization was uncomplicated.  He has had intermittent chest pain ever since discharge from the hospital.  He initially was tried on isosorbide but developed symptomatic hypotension after his first dose.  He went to the emergency room November 25, 2021 with recurrent chest pain.  This felt like a pressure type sensation in the center of his chest, similar to his previous angina.  He has multiple other types of chest discomfort with fleeting sharp pains as well.  In the emergency room, his EKG showed no changes and his troponins were negative x2 with high-sensitivity troponins of 3.  He was discharged home for follow-up today.  The patient has had no recurrent chest pain over the last 48 hours.  He denies shortness of breath, edema, or heart palpitations.  He has had labile blood pressure, noting that his blood pressure when he wakes up in the morning is usually 120/70.  His pressure goes up when he is at work but into the range of 130s over 80s.  His blood pressure today is 136/100 mmHg is very unusual for him.  Past Medical History:  Diagnosis Date   CAD (coronary artery disease), native coronary artery 09/04/2021   s/p DES to Cx, LAD and diagonal   Chronic kidney disease, stage 2 (mild)    Essential hypertension    HLD  (hyperlipidemia)     Past Surgical History:  Procedure Laterality Date   CORONARY/GRAFT ACUTE MI REVASCULARIZATION N/A 09/04/2021   Procedure: Coronary/Graft Acute MI Revascularization;  Surgeon: Sherren Mocha, MD;  Location: [place] [facility];  Service: Cardiovascular;  Laterality: N/A;   LEFT HEART CATH AND CORONARY ANGIOGRAPHY N/A 09/04/2021   Procedure: LEFT HEART CATH AND CORONARY ANGIOGRAPHY;  Surgeon: Sherren Mocha, MD;  Location: [place] [facility];  Service: Cardiovascular;  Laterality: N/A;    Current Medications: Current Meds  Medication Sig   amLODipine (NORVASC) 5 MG tablet Take as directed   Ascorbic Acid (VITAMIN C) 100 MG tablet Take 100 mg by mouth daily.   aspirin 81 MG EC tablet Take 1 tablet (81 mg total) by mouth daily. Swallow whole.   atorvastatin (LIPITOR) 80 MG tablet Take 1 tablet (80 mg total) by mouth daily.   cholecalciferol (VITAMIN D3) 25 MCG (1000 UNIT) tablet Take 1,000 Units by mouth daily.   nitroGLYCERIN (NITROSTAT) 0.4 MG SL tablet Place 1 tablet (0.4 mg total) under the tongue every 5 (five) minutes x 3 doses as needed for chest pain.   Omega-3 1000 MG CAPS Take 1 capsule by mouth daily.   therapeutic multivitamin-minerals (THERAGRAN-M) tablet Take 1 tablet by mouth daily.   ticagrelor (BRILINTA) 90 MG TABS tablet Take 1 tablet (90 mg total) by mouth 2 (two) times daily.     Allergies:   Patient has  no known allergies.   Social History   Socioeconomic History   Marital status: Married    Spouse name: Not on file   Number of children: Not on file   Years of education: Not on file   Highest education level: Not on file  Occupational History   Not on file  Tobacco Use   Smoking status: Never   Smokeless tobacco: Never  Vaping Use   Vaping Use: Never used  Substance and Sexual Activity   Alcohol use: Yes    Comment: few tine time s a year   Drug use: Never   Sexual activity: Not on file  Other Topics Concern   Not on file  Social  History Narrative   Not on file   Social Determinants of Health   Financial Resource Strain: Not on file  Food Insecurity: Not on file  Transportation Needs: Not on file  Physical Activity: Not on file  Stress: Not on file  Social Connections: Not on file     Family History: The patient's family history is negative for Heart attack and Heart disease.  ROS:   Please see the history of present illness.    All other systems reviewed and are negative.  EKGs/Labs/Other Studies Reviewed:    The following studies were reviewed today: Cardiac Catheterization 09/04/21:   Prox LAD to Mid LAD lesion is 75% stenosed.   Post intervention, there is a 0% residual stenosis.   1.  Total occlusion of the mid circumflex, treated with PCI using a 2.5 x 15 mm Onyx frontier DES 2.  Severe stenosis of the proximal LAD, treated with PCI using a 3.5 x 26 mm Onyx frontier DES 3.  Severe stenosis of the first diagonal branch of the LAD, treated with PCI using a 2.75 x 15 mm Onyx frontier DES 4.  Patent, dominant RCA with mild diffuse plaquing and no significant stenosis 5.  Normal LV systolic function with LVEF estimated at 55 to 65%  Recommendations: 2D echo, aggressive medical therapy with LDL goal less than 70 mg/dL, dual antiplatelet therapy with aspirin and Brilinta at least 12 months, as long as no complications arise the patient should be eligible for hospital discharge tomorrow.  EKG:  EKG is not ordered today.  ER EKG reviewed, no acute changes, sinus rhythm.   Recent Labs: 10/23/2021: TSH 1.680 11/25/2021: ALT 25; BUN 16; Creatinine, Ser 1.15; Hemoglobin 14.3; Platelets 259; Potassium 4.0; Sodium 137  Recent Lipid Panel    Component Value Date/Time   CHOL 109 10/23/2021 1026   TRIG 146 10/23/2021 1026   HDL 39 (L) 10/23/2021 1026   CHOLHDL 2.8 10/23/2021 1026   CHOLHDL 4.7 09/05/2021 0240   VLDL 30 09/05/2021 0240   LDLCALC 45 10/23/2021 1026   LDLDIRECT 45 10/23/2021 1026     Risk  Assessment/Calculations:           Physical Exam:    VS:  BP (!) 136/100   Pulse 71   Ht 6\' 2"  (1.88 m)   Wt 211 lb 12.8 oz (96.1 kg)   SpO2 99%   BMI 27.19 kg/m     Wt Readings from Last 3 Encounters:  11/27/21 211 lb 12.8 oz (96.1 kg)  11/25/21 190 lb (86.2 kg)  10/23/21 203 lb 12.8 oz (92.4 kg)     GEN:  Well nourished, well developed in no acute distress HEENT: Normal NECK: No JVD; No carotid bruits LYMPHATICS: No lymphadenopathy CARDIAC: RRR, no murmurs, rubs, gallops RESPIRATORY:  Clear to auscultation without rales, wheezing or rhonchi  ABDOMEN: Soft, non-tender, non-distended MUSCULOSKELETAL:  No edema; No deformity  SKIN: Warm and dry NEUROLOGIC:  Alert and oriented x 3 PSYCHIATRIC:  Normal affect   ASSESSMENT:    1. Precordial pain   2. CAD in native artery   3. Essential hypertension   4. Mixed hyperlipidemia    PLAN:    In order of problems listed above:  ER data reviewed, normal troponins and unremarkable EKG.  Pain is predominantly atypical.  I have recommended an exercise treadmill stress test to better evaluate and make sure there is no residual ischemia.  I reviewed the patient's cardiac catheterization films today and he had PCI of all of the severe lesions in his coronaries.  I would not expect him to have recurrent angina unless there has been some change in his coronary anatomy.  He will continue on aspirin, ticagrelor, and a high intensity statin drug. As above Blood pressure is labile.  He has not been able to take antihypertensive medications because his home blood pressures are low.  He will continue to record blood pressure readings and we will review them at follow-up. Treated with atorvastatin 80 mg daily.  Recent lipids showed an LDL cholesterol of 45 and total cholesterol of 109, down from 191 when he was hospitalized.      Shared Decision Making/Informed Consent The risks [chest pain, shortness of breath, cardiac arrhythmias,  dizziness, blood pressure fluctuations, myocardial infarction, stroke/transient ischemic attack, and life-threatening complications (estimated to be 1 in 10,000)], benefits (risk stratification, diagnosing coronary artery disease, treatment guidance) and alternatives of an exercise tolerance test were discussed in detail with Mr. Langford and he agrees to proceed.    Medication Adjustments/Labs and Tests Ordered: Current medicines are reviewed at length with the patient today.  Concerns regarding medicines are outlined above.  Orders Placed This Encounter  Procedures   EXERCISE TOLERANCE TEST (ETT)   No orders of the defined types were placed in this encounter.   Patient Instructions  Medication Instructions:  Your physician recommends that you continue on your current medications as directed. Please refer to the Current Medication list given to you today.  *If you need a refill on your cardiac medications before your next appointment, please call your pharmacy*  Testing/Procedures: Your physician has requested that you have an exercise tolerance test. For further information please visit [email or web address]. Please also follow instruction sheet, as given.  Follow-Up: At [facility], you and your health needs are our priority.  As part of our continuing mission to provide you with exceptional heart care, we have created designated Provider Care Teams.  These Care Teams include your primary Cardiologist (physician) and Advanced Practice Providers (APPs -  Physician Assistants and Nurse Practitioners) who all work together to provide you with the care you need, when you need it.   Your next appointment:   May 2023  The format for your next appointment:   In Person  Provider:   Sherren Mocha, MD  or Melina Copa, PA-C         Signed, Sherren Mocha, MD  11/27/2021 4:01 PM    [place]

## 2021-12-05 ENCOUNTER — Other Ambulatory Visit: Payer: Self-pay

## 2021-12-05 ENCOUNTER — Ambulatory Visit (INDEPENDENT_AMBULATORY_CARE_PROVIDER_SITE_OTHER): Payer: 59

## 2021-12-05 DIAGNOSIS — R072 Precordial pain: Secondary | ICD-10-CM

## 2021-12-05 LAB — EXERCISE TOLERANCE TEST
Angina Index: 0
Duke Treadmill Score: 10
Estimated workload: 11.7
Exercise duration (min): 10 min
Exercise duration (sec): 10 s
MPHR: 160 {beats}/min
Peak HR: 0 {beats}/min
Percent HR: 93 %
RPE: 16
Rest HR: 66 {beats}/min
ST Depression (mm): 0 mm

## 2021-12-16 ENCOUNTER — Encounter: Payer: Self-pay | Admitting: Cardiovascular Disease

## 2021-12-17 ENCOUNTER — Telehealth: Payer: Self-pay | Admitting: Cardiovascular Disease

## 2021-12-17 NOTE — Telephone Encounter (Signed)
° °  Patient Name:  Donald Jimenez  DOB:  Mar 03, 1961  MRN:  [id number]   Primary Cardiologist: Tonny Bollman, MD  Chart reviewed as part of pre-operative protocol coverage.  Dentist's office was contacted to clarify request.  The pt has a crown that has come off and he needs a tooth extraction and dental implant.   60 yo male with hx of CAD s/p posterior STEMI in 9/22 tx with DES to LCx , LAD and Dx.  He did have recurrent chest pain and was seen by Dr. Excell Seltzer 11/27/21.  A f/u exercise tolerance test was low risk.  Given his hx of myocardial infarction, he requires uninterrupted dual antiplatelet therapy for a minimum of 12 mos.    Simple dental extractions are considered low risk procedures per guidelines and generally do not require any specific cardiac clearance. It is also generally accepted that for simple extractions and dental cleanings, there is no need to interrupt blood thinner therapy.  SBE prophylaxis is not required for the patient from a cardiac standpoint.  If there are concerns for bleeding and there is a need to hold Aspirin or Ticagrelor (Brilinta), the dental procedure will need to be postponed until September 2023.  Aspirin and/or Brilinta cannot be held until after September 2023.  Please call with questions.  Tereso Newcomer, PA-C 12/17/2021, 4:21 PM

## 2021-12-17 NOTE — Telephone Encounter (Signed)
Notes faxed to surgeon. This phone note will be removed from the preop pool. Tereso Newcomer, PA-C  12/17/2021 4:34 PM

## 2021-12-17 NOTE — Telephone Encounter (Signed)
° °  [facility] Pre-operative Risk Assessment    Request for surgical clearance:  What type of surgery is being performed? Extraction and implant    When is this surgery scheduled? 12/18/21  What type of clearance is required (medical clearance vs. Pharmacy clearance to hold med vs. Both)? medical  Are there any medications that need to be held prior to surgery and how long? TBD   Practice name and name of physician performing surgery? Dr Marijean Bravo   What is your office phone number [phone]    7.   What is your office fax number [phone]  8.   Anesthesia type (None, local, MAC, general) ? none   Donald Jimenez 12/17/2021, 4:06 PM  _________________________________________________________________   (provider comments below)

## 2022-04-21 ENCOUNTER — Ambulatory Visit: Payer: 59 | Admitting: Cardiovascular Disease

## 2022-04-21 ENCOUNTER — Encounter: Payer: Self-pay | Admitting: Cardiovascular Disease

## 2022-04-21 VITALS — BP 120/80 | HR 70 | Ht 74.0 in | Wt 190.8 lb

## 2022-04-21 DIAGNOSIS — I10 Essential (primary) hypertension: Secondary | ICD-10-CM | POA: Diagnosis not present

## 2022-04-21 DIAGNOSIS — E782 Mixed hyperlipidemia: Secondary | ICD-10-CM

## 2022-04-21 DIAGNOSIS — I25119 Atherosclerotic heart disease of native coronary artery with unspecified angina pectoris: Secondary | ICD-10-CM | POA: Diagnosis not present

## 2022-04-21 NOTE — Patient Instructions (Signed)
Medication Instructions:  ?Your physician recommends that you continue on your current medications as directed. Please refer to the Current Medication list given to you today.  ?*If you need a refill on your cardiac medications before your next appointment, please call your pharmacy* ? ? ?Lab Work: ?CMET, Lipids prior to next visit in 64m ?If you have labs (blood work) drawn today and your tests are completely normal, you will receive your results only by: ?MyChart Message (if you have MyChart) OR ?A paper copy in the mail ?If you have any lab test that is abnormal or we need to change your treatment, we will call you to review the results. ? ? ?Testing/Procedures: ?NONE ? ? ?Follow-Up: ?At [facility], you and your health needs are our priority.  As part of our continuing mission to provide you with exceptional heart care, we have created designated Provider Care Teams.  These Care Teams include your primary Cardiologist (physician) and Advanced Practice Providers (APPs -  Physician Assistants and Nurse Practitioners) who all work together to provide you with the care you need, when you need it. ? ?Your next appointment:   ?6 month(s) ? ?The format for your next appointment:   ?In Person ? ?Provider:   ?Eligha Bridegroom, NP or Tereso Newcomer, PA-C     Then, Tonny Bollman, MD will plan to see you again in 1 year(s).{ ? ?  ? ?Important Information About Sugar ? ? ? ? ?  ?

## 2022-04-21 NOTE — Progress Notes (Signed)
?Cardiology Office Note:   ? ?Date:  04/21/2022  ? ?ID:  Donald Jimenez, DOB 1961-11-19, MRN [id number] ? ?PCP:  Donald Melter, MD ?  ?[facility] Providers ?Cardiologist:  Donald Mocha, MD    ? ?Referring MD: Donald Melter, MD  ? ?Chief Complaint  ?Patient presents with  ? Coronary Artery Disease  ? ? ?History of Present Illness:   ? ?Donald Jimenez is a 61 y.o. male with a hx of STEMI involving the left circumflex in September 2022.  He underwent revascularization with PCI of the left circumflex, proximal LAD, and first diagonal branches.  He was found to have normal LV systolic function.  Comorbid conditions include hypertension and mixed hyperlipidemia.  The patient's initial hospitalization was uncomplicated. ? ?The patient is here alone today. Today, he denies symptoms of palpitations, chest pain, shortness of breath, orthopnea, PND, lower extremity edema, dizziness, or syncope. He is exercising regularly, walking 30 minutes daily, and going to the gym 3 days weekly. Denies exertional symptoms. States average BP after his walk is about 110/70 mmHg. BP at the gym averages 120/70's. Rarely sees BP greater than 130/80 mmHg.  ? ? ?Past Medical History:  ?Diagnosis Date  ? CAD (coronary artery disease), native coronary artery 09/04/2021  ? s/p DES to Cx, LAD and diagonal  ? Chronic kidney disease, stage 2 (mild)   ? Essential hypertension   ? HLD (hyperlipidemia)   ? ? ?Past Surgical History:  ?Procedure Laterality Date  ? CORONARY/GRAFT ACUTE MI REVASCULARIZATION N/A 09/04/2021  ? Procedure: Coronary/Graft Acute MI Revascularization;  Surgeon: Donald Mocha, MD;  Location: [place] [facility];  Service: Cardiovascular;  Laterality: N/A;  ? LEFT HEART CATH AND CORONARY ANGIOGRAPHY N/A 09/04/2021  ? Procedure: LEFT HEART CATH AND CORONARY ANGIOGRAPHY;  Surgeon: Donald Mocha, MD;  Location: [place] [facility];  Service: Cardiovascular;  Laterality: N/A;  ? ? ?Current Medications: ?Current Meds  ?Medication Sig  ?  Ascorbic Acid (VITAMIN C) 100 MG tablet Take 100 mg by mouth daily.  ? aspirin 81 MG EC tablet Take 1 tablet (81 mg total) by mouth daily. Swallow whole.  ? atorvastatin (LIPITOR) 80 MG tablet Take 1 tablet (80 mg total) by mouth daily.  ? cholecalciferol (VITAMIN D3) 25 MCG (1000 UNIT) tablet Take 1,000 Units by mouth daily.  ? nitroGLYCERIN (NITROSTAT) 0.4 MG SL tablet Place 1 tablet (0.4 mg total) under the tongue every 5 (five) minutes x 3 doses as needed for chest pain.  ? Omega-3 1000 MG CAPS Take 1 capsule by mouth daily.  ? therapeutic multivitamin-minerals (THERAGRAN-M) tablet Take 1 tablet by mouth daily.  ? ticagrelor (BRILINTA) 90 MG TABS tablet Take 1 tablet (90 mg total) by mouth 2 (two) times daily.  ?  ? ?Allergies:   Patient has no known allergies.  ? ?Social History  ? ?Socioeconomic History  ? Marital status: Married  ?  Spouse name: Not on file  ? Number of children: Not on file  ? Years of education: Not on file  ? Highest education level: Not on file  ?Occupational History  ? Not on file  ?Tobacco Use  ? Smoking status: Never  ? Smokeless tobacco: Never  ?Vaping Use  ? Vaping Use: Never used  ?Substance and Sexual Activity  ? Alcohol use: Yes  ?  Comment: few tine time s a year  ? Drug use: Never  ? Sexual activity: Not on file  ?Other Topics Concern  ? Not on file  ?Social History  Narrative  ? Not on file  ? ?Social Determinants of Health  ? ?Financial Resource Strain: Not on file  ?Food Insecurity: Not on file  ?Transportation Needs: Not on file  ?Physical Activity: Not on file  ?Stress: Not on file  ?Social Connections: Not on file  ?  ? ?Family History: ?The patient's family history is negative for Heart attack and Heart disease. ? ?ROS:   ?Please see the history of present illness.    ?All other systems reviewed and are negative. ? ?EKGs/Labs/Other Studies Reviewed:   ? ?The following studies were reviewed today: ?Exercise tolerance test 12/05/2021: ?  No ST deviation was noted. ?  The  patient walked on a standard Bruce protocol treadmill test for 10 minutes.  He achieved a peak heart rate of 150 which is 93% predicted maximal heart rate. ?  At peak exercise there were no ST or T wave changes. ?  There was no QRS widening at peak exercise. ?  There was a hypertensive response to exercise. ?  This is interpreted as a negative exercise test.  He has good exercise capacity. ? ?Cardiac Cath 09/04/2021: ?  Prox LAD to Mid LAD lesion is 75% stenosed. ?  Post intervention, there is a 0% residual stenosis. ?  ?1.  Total occlusion of the mid circumflex, treated with PCI using a 2.5 x 15 mm Onyx frontier DES ?2.  Severe stenosis of the proximal LAD, treated with PCI using a 3.5 x 26 mm Onyx frontier DES ?3.  Severe stenosis of the first diagonal branch of the LAD, treated with PCI using a 2.75 x 15 mm Onyx frontier DES ?4.  Patent, dominant RCA with mild diffuse plaquing and no significant stenosis ?5.  Normal LV systolic function with LVEF estimated at 55 to 65% ? ?Recommendations: 2D echo, aggressive medical therapy with LDL goal less than 70 mg/dL, dual antiplatelet therapy with aspirin and Brilinta at least 12 months, as long as no complications arise the patient should be eligible for hospital discharge tomorrow. ? ?Echo 09/04/2021: ? 1. Left ventricular ejection fraction, by estimation, is 55 to 60%. The  ?left ventricle has normal function. The left ventricle has no regional  ?wall motion abnormalities. Left ventricular diastolic parameters are  ?consistent with Grade I diastolic  ?dysfunction (impaired relaxation).  ? 2. Right ventricular systolic function is normal. The right ventricular  ?size is normal. There is normal pulmonary artery systolic pressure.  ? 3. The mitral valve is normal in structure. Trivial mitral valve  ?regurgitation. No evidence of mitral stenosis.  ? 4. The aortic valve is tricuspid. Aortic valve regurgitation is trivial.  ?No aortic stenosis is present.  ? 5. The inferior  vena cava is normal in size with greater than 50%  ?respiratory variability, suggesting right atrial pressure of 3 mmHg. ? ?EKG:  EKG is not ordered today.   ? ?Recent Labs: ?10/23/2021: TSH 1.680 ?11/25/2021: ALT 25; BUN 16; Creatinine, Ser 1.15; Hemoglobin 14.3; Platelets 259; Potassium 4.0; Sodium 137  ?Recent Lipid Panel ?   ?Component Value Date/Time  ? CHOL 109 10/23/2021 1026  ? TRIG 146 10/23/2021 1026  ? HDL 39 (L) 10/23/2021 1026  ? CHOLHDL 2.8 10/23/2021 1026  ? CHOLHDL 4.7 09/05/2021 0240  ? VLDL 30 09/05/2021 0240  ? LDLCALC 45 10/23/2021 1026  ? LDLDIRECT 45 10/23/2021 1026  ? ? ? ?Risk Assessment/Calculations:   ? ?    ? ?Physical Exam:   ? ?VS:  BP 120/80  Pulse 70   Ht 6\' 2"  (1.88 m)   Wt 190 lb 12.8 oz (86.5 kg)   SpO2 96%   BMI 24.50 kg/m?    ? ?Wt Readings from Last 3 Encounters:  ?04/21/22 190 lb 12.8 oz (86.5 kg)  ?11/27/21 211 lb 12.8 oz (96.1 kg)  ?11/25/21 190 lb (86.2 kg)  ?  ? ?GEN:  Well nourished, well developed in no acute distress ?HEENT: Normal ?NECK: No JVD; No carotid bruits ?LYMPHATICS: No lymphadenopathy ?CARDIAC: RRR, no murmurs, rubs, gallops ?RESPIRATORY:  Clear to auscultation without rales, wheezing or rhonchi  ?ABDOMEN: Soft, non-tender, non-distended ?MUSCULOSKELETAL:  No edema; No deformity  ?SKIN: Warm and dry ?NEUROLOGIC:  Alert and oriented x 3 ?PSYCHIATRIC:  Normal affect  ? ?ASSESSMENT:   ? ?1. Coronary artery disease involving native coronary artery of native heart with angina pectoris ([place])   ?2. Essential hypertension   ?3. Mixed hyperlipidemia   ? ?PLAN:   ? ?In order of problems listed above: ? ?The patient is stable without symptoms of recurrent angina, leading to active lifestyle.  His medical regimen is reviewed and will be continued with aspirin, ticagrelor, and atorvastatin.  I have recommended 30-month APP follow-up and his ticagrelor can be discontinued at that time (he will be greater than 1 year out from his ACS event). ?Blood pressure well  controlled, currently on no antihypertensive therapy.  Based on readings outlined in the HPI, he will not tolerate a beta-blocker and has no indication for other antihypertensive therapy.  ? Last lipids reviewed with cho

## 2022-07-02 ENCOUNTER — Other Ambulatory Visit: Payer: Self-pay

## 2022-07-02 ENCOUNTER — Emergency Department (HOSPITAL_BASED_OUTPATIENT_CLINIC_OR_DEPARTMENT_OTHER): Payer: 59

## 2022-07-02 ENCOUNTER — Emergency Department (HOSPITAL_BASED_OUTPATIENT_CLINIC_OR_DEPARTMENT_OTHER)
Admission: EM | Admit: 2022-07-02 | Discharge: 2022-07-02 | Disposition: A | Discharge status: Home / Self Care | Payer: 59 | Attending: Emergency Medicine | Admitting: Emergency Medicine

## 2022-07-02 ENCOUNTER — Encounter (HOSPITAL_BASED_OUTPATIENT_CLINIC_OR_DEPARTMENT_OTHER): Payer: Self-pay | Admitting: Emergency Medicine

## 2022-07-02 DIAGNOSIS — Z7982 Long term (current) use of aspirin: Secondary | ICD-10-CM | POA: Diagnosis not present

## 2022-07-02 DIAGNOSIS — R079 Chest pain, unspecified: Secondary | ICD-10-CM | POA: Diagnosis present

## 2022-07-02 DIAGNOSIS — M546 Pain in thoracic spine: Secondary | ICD-10-CM | POA: Insufficient documentation

## 2022-07-02 LAB — BASIC METABOLIC PANEL
Anion gap: 8 (ref 5–15)
BUN: 27 mg/dL — ABNORMAL HIGH (ref 8–23)
CO2: 22 mmol/L (ref 22–32)
Calcium: 9.7 mg/dL (ref 8.9–10.3)
Chloride: 104 mmol/L (ref 98–111)
Creatinine, Ser: 1.11 mg/dL (ref 0.61–1.24)
GFR, Estimated: >60 mL/min (ref >60)
Glucose, Bld: 103 mg/dL — ABNORMAL HIGH (ref 70–99)
Potassium: 4 mmol/L (ref 3.5–5.1)
Sodium: 134 mmol/L — ABNORMAL LOW (ref 135–145)

## 2022-07-02 LAB — CBC
HCT: 46.2 % (ref 39.0–52.0)
Hemoglobin: 15.8 g/dL (ref 13.0–17.0)
MCH: 30 pg (ref 26.0–34.0)
MCHC: 34.2 g/dL (ref 30.0–36.0)
MCV: 87.7 fL (ref 80.0–100.0)
Platelets: 265 10*3/uL (ref 150–400)
RBC: 5.27 MIL/uL (ref 4.22–5.81)
RDW: 11.9 % (ref 11.5–15.5)
WBC: 9 10*3/uL (ref 4.0–10.5)
nRBC: 0 % (ref 0.0–0.2)

## 2022-07-02 LAB — TROPONIN I (HIGH SENSITIVITY)
Troponin I (High Sensitivity): 3 ng/L (ref <18)
Troponin I (High Sensitivity): 3 ng/L (ref <18)

## 2022-07-02 MED ORDER — SODIUM CHLORIDE 0.9 % IV SOLN
1.0000 g | Freq: Once | INTRAVENOUS | Status: AC
  Administered 2022-07-02: 1 g via INTRAVENOUS
  Filled 2022-07-02: qty 10

## 2022-07-02 MED ORDER — AZITHROMYCIN 250 MG PO TABS: 250.0000 mg | ORAL_TABLET | Freq: Every day | ORAL | 0 refills | Status: DC

## 2022-07-02 MED ORDER — IOHEXOL 350 MG/ML SOLN
100.0000 mL | Freq: Once | INTRAVENOUS | Status: AC | PRN
  Administered 2022-07-02: 100 mL via INTRAVENOUS
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
## 2022-07-02 NOTE — Discharge Instructions (Signed)
Your CT scan showed the possibility of a developing pneumonia on the left side.  I elected to treat this given you are having left-sided chest pain.  Otherwise your cardiac tests appear normal today.  Follow-up with your cardiologist within the week.  Return back to the ER if you have worsening symptoms or any additional concerns.

## 2022-07-02 NOTE — ED Notes (Signed)
Patient ambulatory to restroom. Steady gait noted.
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
## 2022-07-03 ENCOUNTER — Encounter: Payer: Self-pay | Admitting: Cardiovascular Disease

## 2022-07-22 ENCOUNTER — Other Ambulatory Visit: Payer: Self-pay | Admitting: Cardiovascular Disease

## 2022-09-16 ENCOUNTER — Other Ambulatory Visit: Payer: Self-pay | Admitting: Cardiovascular Disease

## 2022-09-22 ENCOUNTER — Other Ambulatory Visit: Payer: Self-pay | Admitting: Cardiovascular Disease

## 2022-10-21 ENCOUNTER — Other Ambulatory Visit: Payer: Self-pay | Admitting: *Deleted

## 2022-10-21 ENCOUNTER — Ambulatory Visit: Payer: 59 | Attending: Cardiovascular Disease

## 2022-10-21 DIAGNOSIS — I25119 Atherosclerotic heart disease of native coronary artery with unspecified angina pectoris: Secondary | ICD-10-CM

## 2022-10-21 DIAGNOSIS — E782 Mixed hyperlipidemia: Secondary | ICD-10-CM

## 2022-10-21 LAB — COMPREHENSIVE METABOLIC PANEL
ALT: 59 IU/L — ABNORMAL HIGH (ref 0–44)
AST: 47 IU/L — ABNORMAL HIGH (ref 0–40)
Albumin/Globulin Ratio: 1.8 (ref 1.2–2.2)
Albumin: 4.2 g/dL (ref 3.9–4.9)
Alkaline Phosphatase: 88 IU/L (ref 44–121)
BUN/Creatinine Ratio: 14 (ref 10–24)
BUN: 15 mg/dL (ref 8–27)
Bilirubin Total: 0.6 mg/dL (ref 0.0–1.2)
CO2: 24 mmol/L (ref 20–29)
Calcium: 9.6 mg/dL (ref 8.6–10.2)
Chloride: 106 mmol/L (ref 96–106)
Creatinine, Ser: 1.11 mg/dL (ref 0.76–1.27)
Globulin, Total: 2.3 g/dL (ref 1.5–4.5)
Glucose: 92 mg/dL (ref 70–99)
Potassium: 4.4 mmol/L (ref 3.5–5.2)
Sodium: 141 mmol/L (ref 134–144)
Total Protein: 6.5 g/dL (ref 6.0–8.5)
eGFR: 76 mL/min/{1.73_m2} (ref >59)

## 2022-10-21 LAB — LIPID PANEL
Chol/HDL Ratio: 2.4 ratio (ref 0.0–5.0)
Cholesterol, Total: 138 mg/dL (ref 100–199)
HDL: 57 mg/dL (ref >39)
LDL Chol Calc (NIH): 63 mg/dL (ref 0–99)
Triglycerides: 97 mg/dL (ref 0–149)
VLDL Cholesterol Cal: 18 mg/dL (ref 5–40)

## 2022-10-23 ENCOUNTER — Other Ambulatory Visit: Payer: Self-pay | Admitting: Cardiovascular Disease

## 2022-10-23 DIAGNOSIS — I2510 Atherosclerotic heart disease of native coronary artery without angina pectoris: Secondary | ICD-10-CM | POA: Insufficient documentation

## 2022-10-23 NOTE — Progress Notes (Signed)
Cardiology Office Note:    Date:  10/24/2022   ID:  Donald Jimenez, DOB Jun 29, 1961, MRN [id number]  PCP:  Donald Melter, MD  [facility] Cardiologist:  Donald Mocha, MD    Referring MD: Donald Melter, MD   Chief Complaint:  Follow-up for CAD    Patient Profile: Coronary artery disease STEMI 08/2021 >> s/p 3.5 x 26 mm DES to the pLAD; 2.75 x 15 mm DES to D1; 2.5 x 15 mm DES to the mLCx Cath 09/04/2021: Proximal LAD 75, D1 80; mid LCx 100; EF 55-65 Echocardiogram 09/04/2021: EF 55-60, no RWMA, GR 1 DD, normal RVSF, normal      PASP, trivial MR, trivial AI, RAP 3 ETT 12/05/2021: Ex 10"; negative test Hypertension  Hyperlipidemia  Chronic kidney disease    Cardiac Studies & Procedures   CARDIAC CATHETERIZATION  CARDIAC CATHETERIZATION 09/04/2021  Narrative   Prox LAD to Mid LAD lesion is 75% stenosed.   Post intervention, there is a 0% residual stenosis.  1.  Total occlusion of the mid circumflex, treated with PCI using a 2.5 x 15 mm Onyx frontier DES 2.  Severe stenosis of the proximal LAD, treated with PCI using a 3.5 x 26 mm Onyx frontier DES 3.  Severe stenosis of the first diagonal branch of the LAD, treated with PCI using a 2.75 x 15 mm Onyx frontier DES 4.  Patent, dominant RCA with mild diffuse plaquing and no significant stenosis 5.  Normal LV systolic function with LVEF estimated at 55 to 65%  Recommendations: 2D echo, aggressive medical therapy with LDL goal less than 70 mg/dL, dual antiplatelet therapy with aspirin and Brilinta at least 12 months, as long as no complications arise the patient should be eligible for hospital discharge tomorrow.  Findings Coronary Findings Diagnostic  Dominance: Right  Left Anterior Descending Prox LAD to Mid LAD lesion is 75% stenosed. The lesion is eccentric.  First Diagonal Branch 1st Diag lesion is 80% stenosed.  Left Circumflex Mid Cx lesion is 100% stenosed. Vessel is the culprit lesion. The lesion  is heavily thrombotic.  Intervention  Prox LAD to Mid LAD lesion Stent Pre-stent angioplasty was not performed. A drug-eluting stent was successfully placed using a STENT ONYX FRONTIER 3.5X26. Maximum pressure: 16 atm. Post-stent angioplasty was performed using a BALLOON SAPPHIRE [place] [id number]. Post-Intervention Lesion Assessment The intervention was successful. Pre-interventional TIMI flow is 3. Post-intervention TIMI flow is 3. No complications occurred at this lesion. There is a 0% residual stenosis post intervention.  1st Diag lesion Stent A drug-eluting stent was successfully placed using a [place] 2.75X15. Post-stent angioplasty was performed using a BALLOON SAPPHIRE [place] 3.0X12. Maximum pressure:  18 atm. Post-Intervention Lesion Assessment The intervention was successful. Pre-interventional TIMI flow is 3. Post-intervention TIMI flow is 3. No complications occurred at this lesion. There is a 0% residual stenosis post intervention.  Mid Cx lesion Stent CATH LAUNCHER 6FR EBU3.5 guide catheter was inserted. Lesion crossed with guidewire using a WIRE COUGAR XT STRL 190CM. Pre-stent angioplasty was performed. A drug-eluting stent was successfully placed using a STENT ONYX FRONTIER 2.5X15. Maximum pressure: 14 atm. Post-stent angioplasty was not performed. Post-Intervention Lesion Assessment The intervention was successful. Pre-interventional TIMI flow is 0. Post-intervention TIMI flow is 3. No complications occurred at this lesion. There is a 0% residual stenosis post intervention.   STRESS TESTS  EXERCISE TOLERANCE TEST (ETT) 12/05/2021  Narrative   No ST deviation was noted.   The patient walked on a  standard [place] protocol treadmill test for 10 minutes.  He achieved a peak heart rate of 150 which is 93% predicted maximal heart rate.   At peak exercise there were no ST or T wave changes.   There was no QRS widening at peak exercise.   There was a hypertensive response to  exercise.   This is interpreted as a negative exercise test.  He has good exercise capacity.   ECHOCARDIOGRAM  ECHOCARDIOGRAM COMPLETE 09/04/2021  Narrative ECHOCARDIOGRAM REPORT    [name] Date of Exam: 09/04/2021 Medical Rec #:  [id number]    Height:       74.0 in Accession #:    [id number]   Weight:       222.4 lb Date of Birth:  August 16, 1961    BSA:          2.274 m Patient Age:    61 years     BP:           119/83 mmHg Patient Gender: M            HR:           62 bpm. Exam Location:  Inpatient  Procedure: 2D Echo  Indications:    NSTEMI  History:        Patient has no prior history of Echocardiogram examinations.  Sonographer:    Johny Chess RDCS Referring Phys: [place]   1. Left ventricular ejection fraction, by estimation, is 55 to 60%. The left ventricle has normal function. The left ventricle has no regional wall motion abnormalities. Left ventricular diastolic parameters are consistent with Grade I diastolic dysfunction (impaired relaxation). 2. Right ventricular systolic function is normal. The right ventricular size is normal. There is normal pulmonary artery systolic pressure. 3. The mitral valve is normal in structure. Trivial mitral valve regurgitation. No evidence of mitral stenosis. 4. The aortic valve is tricuspid. Aortic valve regurgitation is trivial. No aortic stenosis is present. 5. The inferior vena cava is normal in size with greater than 50% respiratory variability, suggesting right atrial pressure of 3 mmHg.  FINDINGS Left Ventricle: Left ventricular ejection fraction, by estimation, is 55 to 60%. The left ventricle has normal function. The left ventricle has no regional wall motion abnormalities. The left ventricular internal cavity size was normal in size. There is no left ventricular hypertrophy. Left ventricular diastolic parameters are consistent with Grade I diastolic dysfunction (impaired  relaxation). Normal left ventricular filling pressure.  Right Ventricle: The right ventricular size is normal. No increase in right ventricular wall thickness. Right ventricular systolic function is normal. There is normal pulmonary artery systolic pressure. The tricuspid regurgitant velocity is 1.87 m/s, and with an assumed right atrial pressure of 3 mmHg, the estimated right ventricular systolic pressure is 02.5 mmHg.  Left Atrium: Left atrial size was normal in size.  Right Atrium: Right atrial size was normal in size.  Pericardium: There is no evidence of pericardial effusion.  Mitral Valve: The mitral valve is normal in structure. Trivial mitral valve regurgitation. No evidence of mitral valve stenosis.  Tricuspid Valve: The tricuspid valve is normal in structure. Tricuspid valve regurgitation is trivial. No evidence of tricuspid stenosis.  Aortic Valve: The aortic valve is tricuspid. Aortic valve regurgitation is trivial. No aortic stenosis is present.  Pulmonic Valve: The pulmonic valve was normal in structure. Pulmonic valve regurgitation is not visualized. No evidence of pulmonic stenosis.  Aorta: The aortic root is normal in size  and structure.  Venous: The inferior vena cava is normal in size with greater than 50% respiratory variability, suggesting right atrial pressure of 3 mmHg.  IAS/Shunts: No atrial level shunt detected by color flow Doppler.   LEFT VENTRICLE PLAX 2D LVIDd:         4.80 cm  Diastology LVIDs:         3.30 cm  LV e' medial:    7.94 cm/s LV PW:         1.00 cm  LV E/e' medial:  7.6 LV IVS:        1.00 cm  LV e' lateral:   9.68 cm/s LVOT diam:     2.20 cm  LV E/e' lateral: 6.2 LV SV:         71 LV SV Index:   31 LVOT Area:     3.80 cm   RIGHT VENTRICLE             IVC RV S prime:     12.50 cm/s  IVC diam: 1.60 cm TAPSE (M-mode): 2.4 cm  LEFT ATRIUM             Index       RIGHT ATRIUM           Index LA diam:        4.30 cm 1.89 cm/m  RA Area:      12.40 cm LA Vol (A2C):   57.0 ml 25.07 ml/m RA Volume:   28.10 ml  12.36 ml/m LA Vol (A4C):   38.4 ml 16.89 ml/m LA Biplane Vol: 47.7 ml 20.98 ml/m AORTIC VALVE LVOT Vmax:   87.40 cm/s LVOT Vmean:  55.900 cm/s LVOT VTI:    0.188 m  AORTA Ao Root diam: 3.40 cm Ao Asc diam:  3.50 cm  MITRAL VALVE               TRICUSPID VALVE MV Area (PHT): 2.99 cm    TR Peak grad:   14.0 mmHg MV Decel Time: 254 msec    TR Vmax:        187.11 cm/s MV E velocity: 60.20 cm/s MV A velocity: 74.40 cm/s  SHUNTS MV E/A ratio:  0.81        Systemic VTI:  0.19 m Systemic Diam: 2.20 cm  Skeet Latch MD Electronically signed by Skeet Latch MD Signature Date/Time: 09/04/2021/11:17:16 PM    Final              History of Present Illness:   Ronen Bromwell is a 61 y.o. male with the above problem list.  He was last seen by Dr. Burt Knack in May 2023.  He returns for follow-up.  The plan is to discontinue his ticagrelor 12 months out from his event. He is here alone today. He is doing well w/o chest pain, shortness of breath, syncope, orthopnea, leg edema. He did have some chest pain in July and was dx with pneumonia. He has done well since that time. He remains very active.          Past Medical History:  Diagnosis Date   CAD (coronary artery disease), native coronary artery 09/04/2021   STEMI 08/2021 >> s/p DES to Cx, LAD and diagonal / Echo 9/22: normal EF / ETT 12/22 low risk   Chronic kidney disease, stage 2 (mild)    Essential hypertension    HLD (hyperlipidemia)    Current Medications: Current Meds  Medication Sig   Ascorbic Acid (VITAMIN C) 100  MG tablet Take 100 mg by mouth daily.   aspirin EC (ASPIRIN LOW DOSE) 81 MG tablet TAKE 1 TABLET BY MOUTH EVERY DAY   atorvastatin (LIPITOR) 80 MG tablet TAKE 1 TABLET BY MOUTH EVERY DAY   cholecalciferol (VITAMIN D3) 25 MCG (1000 UNIT) tablet Take 1,000 Units by mouth daily.   nitroGLYCERIN (NITROSTAT) 0.4 MG SL tablet Place 1 tablet (0.4 mg  total) under the tongue every 5 (five) minutes x 3 doses as needed for chest pain.   Omega-3 1000 MG CAPS Take 1 capsule by mouth daily.   therapeutic multivitamin-minerals (THERAGRAN-M) tablet Take 1 tablet by mouth daily.   [DISCONTINUED] ticagrelor (BRILINTA) 90 MG TABS tablet TAKE 1 TABLET BY MOUTH TWICE A DAY    Allergies:   Patient has no known allergies.   Social History   Tobacco Use   Smoking status: Never   Smokeless tobacco: Never  Vaping Use   Vaping Use: Never used  Substance Use Topics   Alcohol use: Yes    Comment: few tine time s a year   Drug use: Never    Family Hx: The patient's family history is negative for Heart attack and Heart disease.  Review of Systems  Hematologic/Lymphatic: Bruises/bleeds easily.     EKGs/Labs/Other Test Reviewed:    EKG:  EKG is not ordered today.    Recent Labs: 07/02/2022: Hemoglobin 15.8; Platelets 265 10/21/2022: ALT 59; BUN 15; Creatinine, Ser 1.11; Potassium 4.4; Sodium 141   Recent Lipid Panel Recent Labs    10/21/22 0829  CHOL 138  TRIG 97  HDL 57  LDLCALC 63      Risk Assessment/Calculations/Metrics:              Physical Exam:    VS:  BP 110/60   Pulse (!) 59   Ht _0  (1.88 m)   Wt 210 lb (95.3 kg)   SpO2 96%   BMI 26.96 kg/m     Wt Readings from Last 3 Encounters:  10/24/22 210 lb (95.3 kg)  07/02/22 191 lb 12.8 oz (87 kg)  04/21/22 190 lb 12.8 oz (86.5 kg)    Constitutional:      Appearance: Healthy appearance. Not in distress.  Neck:     Vascular: JVD normal.  Pulmonary:     Effort: Pulmonary effort is normal.     Breath sounds: No wheezing. No rales.  Cardiovascular:     Normal rate. Regular rhythm. Normal S1. Normal S2.      Murmurs: There is no murmur.  Edema:    Peripheral edema absent.  Abdominal:     Palpations: Abdomen is soft.  Skin:    General: Skin is warm and dry.  Neurological:     General: No focal deficit present.     Mental Status: Alert and oriented to  person, place and time.          ASSESSMENT & PLAN:   Coronary artery disease involving native coronary artery of native heart without angina pectoris S/p STEMI in Sept 2022 tx with DES to LCx (infarct vessel), DES to LAD and DES to D1. EF was normal on echocardiogram. ETT in 11/2021 was low risk. He is doing well without angina. He can DC Brilinta now that he is > 12 mos out from his event. Continue ASA, Lipitor. F/u in 6 mos.   Mixed hyperlipidemia LDL optimal. LFTs mildly elevated. He is concerned about this. Dr. Burt Knack planned to get repeat LFTs in 3 mos. I offered  to switch him to Rosuvastatin now vs repeat LFTs on Atorvastatin in 3 mos. He would like to remain on Atorvastatin for now and repeat labs as planned.             Dispo:  Return in about 6 months (around 04/24/2023) for Routine Follow Up, w/ Dr. Burt Knack.   Medication Adjustments/Labs and Tests Ordered: Current medicines are reviewed at length with the patient today.  Concerns regarding medicines are outlined above.  Tests Ordered: Orders Placed This Encounter  Procedures   Hepatic function panel   Medication Changes: No orders of the defined types were placed in this encounter.  Signed, Richardson Dopp, PA-C  10/24/2022 8:34 AM    [facility] [place] [address] [place], [place], [place]  [id number] Phone: [phone]; Fax: [phone]

## 2022-10-24 ENCOUNTER — Encounter: Payer: Self-pay | Admitting: Physician Assistant

## 2022-10-24 ENCOUNTER — Ambulatory Visit: Payer: 59 | Attending: Physician Assistant | Admitting: Physician Assistant

## 2022-10-24 VITALS — BP 110/60 | HR 59 | Ht 74.0 in | Wt 210.0 lb

## 2022-10-24 DIAGNOSIS — E782 Mixed hyperlipidemia: Secondary | ICD-10-CM | POA: Diagnosis not present

## 2022-10-24 DIAGNOSIS — I2510 Atherosclerotic heart disease of native coronary artery without angina pectoris: Secondary | ICD-10-CM

## 2022-10-24 NOTE — Assessment & Plan Note (Signed)
S/p STEMI in Sept 2022 tx with DES to LCx (infarct vessel), DES to LAD and DES to D1. EF was normal on echocardiogram. ETT in 11/2021 was low risk. He is doing well without angina. He can DC Brilinta now that he is > 12 mos out from his event. Continue ASA, Lipitor. F/u in 6 mos.

## 2022-10-24 NOTE — Patient Instructions (Addendum)
Medication Instructions:  Your physician has recommended you make the following change in your medication:   STOP Brilinta    *If you need a refill on your cardiac medications before your next appointment, please call your pharmacy*   Lab Work: 3 MONTHS:  LFT  If you have labs (blood work) drawn today and your tests are completely normal, you will receive your results only by: [place] (if you have MyChart) OR A paper copy in the mail If you have any lab test that is abnormal or we need to change your treatment, we will call you to review the results.   Testing/Procedures: None ordered   Follow-Up: At [facility], you and your health needs are our priority.  As part of our continuing mission to provide you with exceptional heart care, we have created designated Provider Care Teams.  These Care Teams include your primary Cardiologist (physician) and Advanced Practice Providers (APPs -  Physician Assistants and Nurse Practitioners) who all work together to provide you with the care you need, when you need it.  We recommend signing up for the patient portal called "MyChart".  Sign up information is provided on this After Visit Summary.  MyChart is used to connect with patients for Virtual Visits (Telemedicine).  Patients are able to view lab/test results, encounter notes, upcoming appointments, etc.  Non-urgent messages can be sent to your provider as well.   To learn more about what you can do with MyChart, go to [email or web address].    Your next appointment:   6 month(s)  The format for your next appointment:   In Person  Provider:   Sherren Mocha, MD     Other Instructions   Important Information About Sugar

## 2022-10-24 NOTE — Assessment & Plan Note (Signed)
LDL optimal. LFTs mildly elevated. He is concerned about this. Dr. Burt Knack planned to get repeat LFTs in 3 mos. I offered to switch him to Rosuvastatin now vs repeat LFTs on Atorvastatin in 3 mos. He would like to remain on Atorvastatin for now and repeat labs as planned.

## 2023-01-23 ENCOUNTER — Ambulatory Visit: Payer: 59

## 2023-01-23 DIAGNOSIS — I2510 Atherosclerotic heart disease of native coronary artery without angina pectoris: Secondary | ICD-10-CM

## 2023-01-23 DIAGNOSIS — E782 Mixed hyperlipidemia: Secondary | ICD-10-CM

## 2023-01-23 LAB — HEPATIC FUNCTION PANEL
ALT: 35 IU/L (ref 0–44)
AST: 33 IU/L (ref 0–40)
Albumin: 4.1 g/dL (ref 3.9–4.9)
Alkaline Phosphatase: 78 IU/L (ref 44–121)
Bilirubin Total: 0.9 mg/dL (ref 0.0–1.2)
Bilirubin, Direct: 0.25 mg/dL (ref 0.00–0.40)
Total Protein: 6.4 g/dL (ref 6.0–8.5)

## 2023-01-27 ENCOUNTER — Telehealth: Payer: Self-pay | Admitting: Cardiovascular Disease

## 2023-01-27 NOTE — Telephone Encounter (Signed)
Patient states he got a Pharmacist, community message about his lab appointment saying he missed it, but he has lab results in his chart. He says he was waiting to have his name called when he came in and it never happened so the man drawing labs said to just go ahead and he would take care of it.

## 2023-01-27 NOTE — Telephone Encounter (Signed)
The appointment has been corrected to completed, as there are lab results charted for that DOS.

## 2023-04-27 ENCOUNTER — Ambulatory Visit: Payer: 59 | Admitting: Cardiovascular Disease

## 2023-05-18 NOTE — Progress Notes (Unsigned)
Office Visit    Patient Name: Donald Jimenez Date of Encounter: 05/19/2023  PCP:  Joycelyn Rua, MD   [facility]  Cardiologist:  Tonny Bollman, MD  Advanced Practice Provider:  No care team member to display Electrophysiologist:  None   HPI    Donald Jimenez is a 62 y.o. male presents today for follow-up visit.   Patient Profile: Coronary artery disease STEMI 08/2021 >> s/p 3.5 x 26 mm DES to the pLAD; 2.75 x 15 mm DES to D1; 2.5 x 15 mm DES to the mLCx Cath 09/04/2021: Proximal LAD 75, D1 80; mid LCx 100; EF 55-65 Echocardiogram 09/04/2021: EF 55-60, no RWMA, GR 1 DD, normal RVSF, normal      PASP, trivial MR, trivial AI, RAP 3 ETT 12/05/2021: Ex 10"; negative test Hypertension  Hyperlipidemia  Chronic kidney disease   Patient was seen November 2023.  At that time the plan was to discontinue his ticagrelor 12 months out from his event.  Was doing well without any chest pain, shortness of breath, syncope, orthopnea, leg edema.  Did have some chest pain July 2023 and was diagnosed with pneumonia.  Done well since that time.  Remains very active.  Today, he tells me that he has not had any recent chest pains.  No shortness of breath unless he is exerting himself.  We reviewed his cardiac catheterization from 2022 when he had his stents placed.  No residual stenosis outside of stents at that time.  Discussed continuing current medications and that his LDL goal should be less than 55.  Blood pressure slightly elevated today but has been well-controlled at home.  Reports no shortness of breath nor dyspnea on exertion. Reports no chest pain, pressure, or tightness. No edema, orthopnea, PND. Reports no palpitations.   Past Medical History    Past Medical History:  Diagnosis Date   CAD (coronary artery disease), native coronary artery 09/04/2021   STEMI 08/2021 >> s/p DES to Cx, LAD and diagonal / Echo 9/22: normal EF / ETT 12/22 low risk   Chronic kidney  disease, stage 2 (mild)    Essential hypertension    HLD (hyperlipidemia)    Past Surgical History:  Procedure Laterality Date   CORONARY/GRAFT ACUTE MI REVASCULARIZATION N/A 09/04/2021   Procedure: Coronary/Graft Acute MI Revascularization;  Surgeon: Tonny Bollman, MD;  Location[facility] INVASIVE CV LAB;  Service: Cardiovascular;  Laterality: N/A;   LEFT HEART CATH AND CORONARY ANGIOGRAPHY N/A 09/04/2021   Procedure: LEFT HEART CATH AND CORONARY ANGIOGRAPHY;  Surgeon: Tonny Bollman, MD;  Location: [facility] INVASIVE CV LAB;  Service: Cardiovascular;  Laterality: N/A;    Allergies  No Known Allergies   EKGs/Labs/Other Studies Reviewed:   The following studies were reviewed today: Cardiac Studies & Procedures   CARDIAC CATHETERIZATION  CARDIAC CATHETERIZATION 09/04/2021  Narrative   Prox LAD to Mid LAD lesion is 75% stenosed.   Post intervention, there is a 0% residual stenosis.  1.  Total occlusion of the mid circumflex, treated with PCI using a 2.5 x 15 mm Onyx frontier DES 2.  Severe stenosis of the proximal LAD, treated with PCI using a 3.5 x 26 mm Onyx frontier DES 3.  Severe stenosis of the first diagonal branch of the LAD, treated with PCI using a 2.75 x 15 mm Onyx frontier DES 4.  Patent, dominant RCA with mild diffuse plaquing and no significant stenosis 5.  Normal LV systolic function with LVEF estimated at 55 to 65%  Recommendations: 2D echo, aggressive medical therapy with LDL goal less than 70 mg/dL, dual antiplatelet therapy with aspirin and Brilinta at least 12 months, as long as no complications arise the patient should be eligible for hospital discharge tomorrow.  Findings Coronary Findings Diagnostic  Dominance: Right  Left Anterior Descending Prox LAD to Mid LAD lesion is 75% stenosed. The lesion is eccentric.  First Diagonal Branch 1st Diag lesion is 80% stenosed.  Left Circumflex Mid Cx lesion is 100% stenosed. Vessel is the culprit lesion. The lesion is  heavily thrombotic.  Intervention  Prox LAD to Mid LAD lesion Stent Pre-stent angioplasty was not performed. A drug-eluting stent was successfully placed using a STENT ONYX FRONTIER 3.5X26. Maximum pressure: 16 atm. Post-stent angioplasty was performed using a BALLOON SAPPHIRE [place] [id number]. Post-Intervention Lesion Assessment The intervention was successful. Pre-interventional TIMI flow is 3. Post-intervention TIMI flow is 3. No complications occurred at this lesion. There is a 0% residual stenosis post intervention.  1st Diag lesion Stent A drug-eluting stent was successfully placed using a STENT ONYX FRONTIER 2.75X15. Post-stent angioplasty was performed using a BALLOON SAPPHIRE [place] 3.0X12. Maximum pressure:  18 atm. Post-Intervention Lesion Assessment The intervention was successful. Pre-interventional TIMI flow is 3. Post-intervention TIMI flow is 3. No complications occurred at this lesion. There is a 0% residual stenosis post intervention.  Mid Cx lesion Stent CATH LAUNCHER 6FR EBU3.5 guide catheter was inserted. Lesion crossed with guidewire using a WIRE COUGAR XT STRL 190CM. Pre-stent angioplasty was performed. A drug-eluting stent was successfully placed using a STENT ONYX FRONTIER 2.5X15. Maximum pressure: 14 atm. Post-stent angioplasty was not performed. Post-Intervention Lesion Assessment The intervention was successful. Pre-interventional TIMI flow is 0. Post-intervention TIMI flow is 3. No complications occurred at this lesion. There is a 0% residual stenosis post intervention.   STRESS TESTS  EXERCISE TOLERANCE TEST (ETT) 12/05/2021  Narrative   No ST deviation was noted.   The patient walked on a standard Bruce protocol treadmill test for 10 minutes.  He achieved a peak heart rate of 150 which is 93% predicted maximal heart rate.   At peak exercise there were no ST or T wave changes.   There was no QRS widening at peak exercise.   There was a hypertensive response to  exercise.   This is interpreted as a negative exercise test.  He has good exercise capacity.   ECHOCARDIOGRAM  ECHOCARDIOGRAM COMPLETE 09/04/2021  Narrative ECHOCARDIOGRAM REPORT    [name] Date of Exam: 09/04/2021 Medical Rec #:  [id number]    Height:       74.0 in Accession #:    [id number]   Weight:       222.4 lb Date of Birth:  07/26/1961    BSA:          2.274 m Patient Age:    60 years     BP:           119/83 mmHg Patient Gender: M            HR:           62 bpm. Exam Location:  Inpatient  Procedure: 2D Echo  Indications:    NSTEMI  History:        Patient has no prior history of Echocardiogram examinations.  Sonographer:    Delcie Roch RDCS Referring Phys: [phone] MICHAEL COOPER  IMPRESSIONS   1. Left ventricular ejection fraction, by estimation, is 55 to 60%. The left ventricle has  normal function. The left ventricle has no regional wall motion abnormalities. Left ventricular diastolic parameters are consistent with Grade I diastolic dysfunction (impaired relaxation). 2. Right ventricular systolic function is normal. The right ventricular size is normal. There is normal pulmonary artery systolic pressure. 3. The mitral valve is normal in structure. Trivial mitral valve regurgitation. No evidence of mitral stenosis. 4. The aortic valve is tricuspid. Aortic valve regurgitation is trivial. No aortic stenosis is present. 5. The inferior vena cava is normal in size with greater than 50% respiratory variability, suggesting right atrial pressure of 3 mmHg.  FINDINGS Left Ventricle: Left ventricular ejection fraction, by estimation, is 55 to 60%. The left ventricle has normal function. The left ventricle has no regional wall motion abnormalities. The left ventricular internal cavity size was normal in size. There is no left ventricular hypertrophy. Left ventricular diastolic parameters are consistent with Grade I diastolic dysfunction (impaired  relaxation). Normal left ventricular filling pressure.  Right Ventricle: The right ventricular size is normal. No increase in right ventricular wall thickness. Right ventricular systolic function is normal. There is normal pulmonary artery systolic pressure. The tricuspid regurgitant velocity is 1.87 m/s, and with an assumed right atrial pressure of 3 mmHg, the estimated right ventricular systolic pressure is 17.0 mmHg.  Left Atrium: Left atrial size was normal in size.  Right Atrium: Right atrial size was normal in size.  Pericardium: There is no evidence of pericardial effusion.  Mitral Valve: The mitral valve is normal in structure. Trivial mitral valve regurgitation. No evidence of mitral valve stenosis.  Tricuspid Valve: The tricuspid valve is normal in structure. Tricuspid valve regurgitation is trivial. No evidence of tricuspid stenosis.  Aortic Valve: The aortic valve is tricuspid. Aortic valve regurgitation is trivial. No aortic stenosis is present.  Pulmonic Valve: The pulmonic valve was normal in structure. Pulmonic valve regurgitation is not visualized. No evidence of pulmonic stenosis.  Aorta: The aortic root is normal in size and structure.  Venous: The inferior vena cava is normal in size with greater than 50% respiratory variability, suggesting right atrial pressure of 3 mmHg.  IAS/Shunts: No atrial level shunt detected by color flow Doppler.   LEFT VENTRICLE PLAX 2D LVIDd:         4.80 cm  Diastology LVIDs:         3.30 cm  LV e' medial:    7.94 cm/s LV PW:         1.00 cm  LV E/e' medial:  7.6 LV IVS:        1.00 cm  LV e' lateral:   9.68 cm/s LVOT diam:     2.20 cm  LV E/e' lateral: 6.2 LV SV:         71 LV SV Index:   31 LVOT Area:     3.80 cm   RIGHT VENTRICLE             IVC RV S prime:     12.50 cm/s  IVC diam: 1.60 cm TAPSE (M-mode): 2.4 cm  LEFT ATRIUM             Index       RIGHT ATRIUM           Index LA diam:        4.30 cm 1.89 cm/m  RA Area:      12.40 cm LA Vol (A2C):   57.0 ml 25.07 ml/m RA Volume:   28.10 ml  12.36 ml/m LA Vol (A4C):  38.4 ml 16.89 ml/m LA Biplane Vol: 47.7 ml 20.98 ml/m AORTIC VALVE LVOT Vmax:   87.40 cm/s LVOT Vmean:  55.900 cm/s LVOT VTI:    0.188 m  AORTA Ao Root diam: 3.40 cm Ao Asc diam:  3.50 cm  MITRAL VALVE               TRICUSPID VALVE MV Area (PHT): 2.99 cm    TR Peak grad:   14.0 mmHg MV Decel Time: 254 msec    TR Vmax:        187.11 cm/s MV E velocity: 60.20 cm/s MV A velocity: 74.40 cm/s  SHUNTS MV E/A ratio:  0.81        Systemic VTI:  0.19 m Systemic Diam: 2.20 cm  Chilton Si MD Electronically signed by Chilton Si MD Signature Date/Time: 09/04/2021/11:17:16 PM    Final              EKG:  EKG is  ordered today.  The ekg ordered today demonstrates normal sinus rhythm, rate 58 bpm  Recent Labs: 07/02/2022: Hemoglobin 15.8; Platelets 265 10/21/2022: BUN 15; Creatinine, Ser 1.11; Potassium 4.4; Sodium 141 01/23/2023: ALT 35  Recent Lipid Panel    Component Value Date/Time   CHOL 138 10/21/2022 0829   TRIG 97 10/21/2022 0829   HDL 57 10/21/2022 0829   CHOLHDL 2.4 10/21/2022 0829   CHOLHDL 4.7 09/05/2021 0240   VLDL 30 09/05/2021 0240   LDLCALC 63 10/21/2022 0829   LDLDIRECT 45 10/23/2021 1026    Home Medications   Current Meds  Medication Sig   Ascorbic Acid (VITAMIN C) 100 MG tablet Take 100 mg by mouth daily.   aspirin EC (ASPIRIN LOW DOSE) 81 MG tablet TAKE 1 TABLET BY MOUTH EVERY DAY   atorvastatin (LIPITOR) 80 MG tablet TAKE 1 TABLET BY MOUTH EVERY DAY   cholecalciferol (VITAMIN D3) 25 MCG (1000 UNIT) tablet Take 1,000 Units by mouth daily.   nitroGLYCERIN (NITROSTAT) 0.4 MG SL tablet Place 1 tablet (0.4 mg total) under the tongue every 5 (five) minutes x 3 doses as needed for chest pain.   Omega-3 1000 MG CAPS Take 1 capsule by mouth daily.   therapeutic multivitamin-minerals (THERAGRAN-M) tablet Take 1 tablet by mouth daily.     Review of  Systems      All other systems reviewed and are otherwise negative except as noted above.  Physical Exam    VS:  BP (!) 148/82   Pulse (!) 58   Ht 6\' 2"  (1.88 m)   Wt 210 lb 6.4 oz (95.4 kg)   SpO2 97%   BMI 27.01 kg/m  , BMI Body mass index is 27.01 kg/m.  Wt Readings from Last 3 Encounters:  05/19/23 210 lb 6.4 oz (95.4 kg)  10/24/22 210 lb (95.3 kg)  07/02/22 191 lb 12.8 oz (87 kg)     GEN: Well nourished, well developed, in no acute distress. HEENT: normal. Neck: Supple, no JVD, carotid bruits, or masses. Cardiac: RRR, no murmurs, rubs, or gallops. No clubbing, cyanosis, edema.  Radials/PT 2+ and equal bilaterally.  Respiratory:  Respirations regular and unlabored, clear to auscultation bilaterally. GI: Soft, nontender, nondistended. MS: No deformity or atrophy. Skin: Warm and dry, no rash. Neuro:  Strength and sensation are intact. Psych: Normal affect.  Assessment & Plan    Coronary artery disease -did not have any anginal symptoms other than some "irritation"  -no family history -Continue ASA 81 mg, Lipitor 80 mg daily, nitro as  needed  Mixed hyperlipidemia -Plan for lipid panel and LFTs during his appointment with his PCP -Continue Lipitor 80 mg daily -Last LDL 63, HDL 57 -discussed mediterranean diet   Hypertension -120s/70s at home but always elevated when he comes to the office -continue current medications -Continue to monitor at home an hour after morning medications   Disposition: Follow up 1 year with Tonny Bollman, MD or APP.  Signed, Sharlene Dory, PA-C 05/19/2023, 10:01 AM [facility]

## 2023-05-19 ENCOUNTER — Encounter: Payer: Self-pay | Admitting: Physician Assistant

## 2023-05-19 ENCOUNTER — Ambulatory Visit: Payer: 59 | Attending: Physician Assistant | Admitting: Physician Assistant

## 2023-05-19 VITALS — BP 148/82 | HR 58 | Ht 74.0 in | Wt 210.4 lb

## 2023-05-19 DIAGNOSIS — I10 Essential (primary) hypertension: Secondary | ICD-10-CM | POA: Diagnosis not present

## 2023-05-19 DIAGNOSIS — E785 Hyperlipidemia, unspecified: Secondary | ICD-10-CM | POA: Diagnosis not present

## 2023-05-19 DIAGNOSIS — I2510 Atherosclerotic heart disease of native coronary artery without angina pectoris: Secondary | ICD-10-CM | POA: Diagnosis not present

## 2023-05-19 NOTE — Patient Instructions (Addendum)
Medication Instructions:   Your physician recommends that you continue on your current medications as directed. Please refer to the Current Medication list given to you today.   *If you need a refill on your cardiac medications before your next appointment, please call your pharmacy*   Lab Work:  RETURN  LFT AND LIPIDS NEXT MONTH    If you have labs (blood work) drawn today and your tests are completely normal, you will receive your results only by: MyChart Message (if you have MyChart) OR A paper copy in the mail If you have any lab test that is abnormal or we need to change your treatment, we will call you to review the results.   Testing/Procedures: NONE ORDERED  TODAY     Follow-Up: At [facility], you and your health needs are our priority.  As part of our continuing mission to provide you with exceptional heart care, we have created designated Provider Care Teams.  These Care Teams include your primary Cardiologist (physician) and Advanced Practice Providers (APPs -  Physician Assistants and Nurse Practitioners) who all work together to provide you with the care you need, when you need it.  We recommend signing up for the patient portal called "MyChart".  Sign up information is provided on this After Visit Summary.  MyChart is used to connect with patients for Virtual Visits (Telemedicine).  Patients are able to view lab/test results, encounter notes, upcoming appointments, etc.  Non-urgent messages can be sent to your provider as well.   To learn more about what you can do with MyChart, go to [email or web address].    Your next appointment:   1 year(s)  Provider:   Tonny Bollman, MD     Other Instructions Heart-Healthy Eating Plan Eating a healthy diet is important for the health of your heart. A heart-healthy eating plan includes: Eating less unhealthy fats. Eating more healthy fats. Eating less salt in your food. Salt is also called sodium. Making other  changes in your diet. Talk with your doctor or a diet specialist (dietitian) to create an eating plan that is right for you. What is my plan? Your doctor may recommend an eating plan that includes: Total fat: ______% or less of total calories a day. Saturated fat: ______% or less of total calories a day. Cholesterol: less than _________mg a day. Sodium: less than _________mg a day. What are tips for following this plan? Cooking Avoid frying your food. Try to bake, boil, grill, or broil it instead. You can also reduce fat by: Removing the skin from poultry. Removing all visible fats from meats. Steaming vegetables in water or broth. Meal planning  At meals, divide your plate into four equal parts: Fill one-half of your plate with vegetables and green salads. Fill one-fourth of your plate with whole grains. Fill one-fourth of your plate with lean protein foods. Eat 2-4 cups of vegetables per day. One cup of vegetables is: 1 cup (91 g) broccoli or cauliflower florets. 2 medium carrots. 1 large bell pepper. 1 large sweet potato. 1 large tomato. 1 medium white potato. 2 cups (150 g) raw leafy greens. Eat 1-2 cups of fruit per day. One cup of fruit is: 1 small apple 1 large banana 1 cup (237 g) mixed fruit, 1 large orange,  cup (82 g) dried fruit, 1 cup (240 mL) 100% fruit juice. Eat more foods that have soluble fiber. These are apples, broccoli, carrots, beans, peas, and barley. Try to get 20-30 g of fiber per day.  Eat 4-5 servings of nuts, legumes, and seeds per week: 1 serving of dried beans or legumes equals  cup (90 g) cooked. 1 serving of nuts is  oz (12 almonds, 24 pistachios, or 7 walnut halves). 1 serving of seeds equals  oz (8 g). General information Eat more home-cooked food. Eat less restaurant, buffet, and fast food. Limit or avoid alcohol. Limit foods that are high in starch and sugar. Avoid fried foods. Lose weight if you are overweight. Keep track of how  much salt (sodium) you eat. This is important if you have high blood pressure. Ask your doctor to tell you more about this. Try to add vegetarian meals each week. Fats Choose healthy fats. These include olive oil and canola oil, flaxseeds, walnuts, almonds, and seeds. Eat more omega-3 fats. These include salmon, mackerel, sardines, tuna, flaxseed oil, and ground flaxseeds. Try to eat fish at least 2 times each week. Check food labels. Avoid foods with trans fats or high amounts of saturated fat. Limit saturated fats. These are often found in animal products, such as meats, butter, and cream. These are also found in plant foods, such as palm oil, palm kernel oil, and coconut oil. Avoid foods with partially hydrogenated oils in them. These have trans fats. Examples are stick margarine, some tub margarines, cookies, crackers, and other baked goods. What foods should I eat? Fruits All fresh, canned (in natural juice), or frozen fruits. Vegetables Fresh or frozen vegetables (raw, steamed, roasted, or grilled). Green salads. Grains Most grains. Choose whole wheat and whole grains most of the time. Rice and pasta, including brown rice and pastas made with whole wheat. Meats and other proteins Lean, well-trimmed beef, veal, pork, and lamb. Chicken and [place] without skin. All fish and shellfish. Wild duck, rabbit, pheasant, and venison. Egg whites or low-cholesterol egg substitutes. Dried beans, peas, lentils, and tofu. Seeds and most nuts. Dairy Low-fat or nonfat cheeses, including ricotta and mozzarella. Skim or 1% milk that is liquid, powdered, or evaporated. Buttermilk that is made with low-fat milk. Nonfat or low-fat yogurt. Fats and oils Non-hydrogenated (trans-free) margarines. Vegetable oils, including soybean, sesame, sunflower, olive, peanut, safflower, corn, canola, and cottonseed. Salad dressings or mayonnaise made with a vegetable oil. Beverages Mineral water. Coffee and tea. Diet  carbonated beverages. Sweets and desserts Sherbet, gelatin, and fruit ice. Small amounts of dark chocolate. Limit all sweets and desserts. Seasonings and condiments All seasonings and condiments. The items listed above may not be a complete list of foods and drinks you can eat. Contact a dietitian for more options. What foods should I avoid? Fruits Canned fruit in heavy syrup. Fruit in cream or butter sauce. Fried fruit. Limit coconut. Vegetables Vegetables cooked in cheese, cream, or butter sauce. Fried vegetables. Grains Breads that are made with saturated or trans fats, oils, or whole milk. Croissants. Sweet rolls. Donuts. High-fat crackers, such as cheese crackers. Meats and other proteins Fatty meats, such as hot dogs, ribs, sausage, bacon, rib-eye roast or steak. High-fat deli meats, such as salami and bologna. Caviar. Domestic duck and goose. Organ meats, such as liver. Dairy Cream, sour cream, cream cheese, and creamed cottage cheese. Whole-milk cheeses. Whole or 2% milk that is liquid, evaporated, or condensed. Whole buttermilk. Cream sauce or high-fat cheese sauce. Yogurt that is made from whole milk. Fats and oils Meat fat, or shortening. Cocoa butter, hydrogenated oils, palm oil, coconut oil, palm kernel oil. Solid fats and shortenings, including bacon fat, salt pork, lard, and butter. Nondairy cream  substitutes. Salad dressings with cheese or sour cream. Beverages Regular sodas and juice drinks with added sugar. Sweets and desserts Frosting. Pudding. Cookies. Cakes. Pies. Milk chocolate or white chocolate. Buttered syrups. Full-fat ice cream or ice cream drinks. The items listed above may not be a complete list of foods and drinks to avoid. Contact a dietitian for more information. Summary Heart-healthy meal planning includes eating less unhealthy fats, eating more healthy fats, and making other changes in your diet. Eat a balanced diet. This includes fruits and vegetables,  low-fat or nonfat dairy, lean protein, nuts and legumes, whole grains, and heart-healthy oils and fats. This information is not intended to replace advice given to you by your health care provider. Make sure you discuss any questions you have with your health care provider. Document Revised: 01/13/2022 Document Reviewed: 01/13/2022 Elsevier Patient Education  2024 [employer]   Heart-Healthy Eating Plan Eating a healthy diet is important for the health of your heart. A heart-healthy eating plan includes: Eating less unhealthy fats. Eating more healthy fats. Eating less salt in your food. Salt is also called sodium. Making other changes in your diet. Talk with your doctor or a diet specialist (dietitian) to create an eating plan that is right for you. What is my plan? Your doctor may recommend an eating plan that includes: Total fat: ______% or less of total calories a day. Saturated fat: ______% or less of total calories a day. Cholesterol: less than _________mg a day. Sodium: less than _________mg a day. What are tips for following this plan? Cooking Avoid frying your food. Try to bake, boil, grill, or broil it instead. You can also reduce fat by: Removing the skin from poultry. Removing all visible fats from meats. Steaming vegetables in water or broth. Meal planning  At meals, divide your plate into four equal parts: Fill one-half of your plate with vegetables and green salads. Fill one-fourth of your plate with whole grains. Fill one-fourth of your plate with lean protein foods. Eat 2-4 cups of vegetables per day. One cup of vegetables is: 1 cup (91 g) broccoli or cauliflower florets. 2 medium carrots. 1 large bell pepper. 1 large sweet potato. 1 large tomato. 1 medium white potato. 2 cups (150 g) raw leafy greens. Eat 1-2 cups of fruit per day. One cup of fruit is: 1 small apple 1 large banana 1 cup (237 g) mixed fruit, 1 large orange,  cup (82 g) dried  fruit, 1 cup (240 mL) 100% fruit juice. Eat more foods that have soluble fiber. These are apples, broccoli, carrots, beans, peas, and barley. Try to get 20-30 g of fiber per day. Eat 4-5 servings of nuts, legumes, and seeds per week: 1 serving of dried beans or legumes equals  cup (90 g) cooked. 1 serving of nuts is  oz (12 almonds, 24 pistachios, or 7 walnut halves). 1 serving of seeds equals  oz (8 g). General information Eat more home-cooked food. Eat less restaurant, buffet, and fast food. Limit or avoid alcohol. Limit foods that are high in starch and sugar. Avoid fried foods. Lose weight if you are overweight. Keep track of how much salt (sodium) you eat. This is important if you have high blood pressure. Ask your doctor to tell you more about this. Try to add vegetarian meals each week. Fats Choose healthy fats. These include olive oil and canola oil, flaxseeds, walnuts, almonds, and seeds. Eat more omega-3 fats. These include salmon, mackerel, sardines, tuna, flaxseed oil, and ground  flaxseeds. Try to eat fish at least 2 times each week. Check food labels. Avoid foods with trans fats or high amounts of saturated fat. Limit saturated fats. These are often found in animal products, such as meats, butter, and cream. These are also found in plant foods, such as palm oil, palm kernel oil, and coconut oil. Avoid foods with partially hydrogenated oils in them. These have trans fats. Examples are stick margarine, some tub margarines, cookies, crackers, and other baked goods. What foods should I eat? Fruits All fresh, canned (in natural juice), or frozen fruits. Vegetables Fresh or frozen vegetables (raw, steamed, roasted, or grilled). Green salads. Grains Most grains. Choose whole wheat and whole grains most of the time. Rice and pasta, including brown rice and pastas made with whole wheat. Meats and other proteins Lean, well-trimmed beef, veal, pork, and lamb. Chicken and [place]  without skin. All fish and shellfish. Wild duck, rabbit, pheasant, and venison. Egg whites or low-cholesterol egg substitutes. Dried beans, peas, lentils, and tofu. Seeds and most nuts. Dairy Low-fat or nonfat cheeses, including ricotta and mozzarella. Skim or 1% milk that is liquid, powdered, or evaporated. Buttermilk that is made with low-fat milk. Nonfat or low-fat yogurt. Fats and oils Non-hydrogenated (trans-free) margarines. Vegetable oils, including soybean, sesame, sunflower, olive, peanut, safflower, corn, canola, and cottonseed. Salad dressings or mayonnaise made with a vegetable oil. Beverages Mineral water. Coffee and tea. Diet carbonated beverages. Sweets and desserts Sherbet, gelatin, and fruit ice. Small amounts of dark chocolate. Limit all sweets and desserts. Seasonings and condiments All seasonings and condiments. The items listed above may not be a complete list of foods and drinks you can eat. Contact a dietitian for more options. What foods should I avoid? Fruits Canned fruit in heavy syrup. Fruit in cream or butter sauce. Fried fruit. Limit coconut. Vegetables Vegetables cooked in cheese, cream, or butter sauce. Fried vegetables. Grains Breads that are made with saturated or trans fats, oils, or whole milk. Croissants. Sweet rolls. Donuts. High-fat crackers, such as cheese crackers. Meats and other proteins Fatty meats, such as hot dogs, ribs, sausage, bacon, rib-eye roast or steak. High-fat deli meats, such as salami and bologna. Caviar. Domestic duck and goose. Organ meats, such as liver. Dairy Cream, sour cream, cream cheese, and creamed cottage cheese. Whole-milk cheeses. Whole or 2% milk that is liquid, evaporated, or condensed. Whole buttermilk. Cream sauce or high-fat cheese sauce. Yogurt that is made from whole milk. Fats and oils Meat fat, or shortening. Cocoa butter, hydrogenated oils, palm oil, coconut oil, palm kernel oil. Solid fats and shortenings,  including bacon fat, salt pork, lard, and butter. Nondairy cream substitutes. Salad dressings with cheese or sour cream. Beverages Regular sodas and juice drinks with added sugar. Sweets and desserts Frosting. Pudding. Cookies. Cakes. Pies. Milk chocolate or white chocolate. Buttered syrups. Full-fat ice cream or ice cream drinks. The items listed above may not be a complete list of foods and drinks to avoid. Contact a dietitian for more information. Summary Heart-healthy meal planning includes eating less unhealthy fats, eating more healthy fats, and making other changes in your diet. Eat a balanced diet. This includes fruits and vegetables, low-fat or nonfat dairy, lean protein, nuts and legumes, whole grains, and heart-healthy oils and fats. This information is not intended to replace advice given to you by your health care provider. Make sure you discuss any questions you have with your health care provider. Document Revised: 01/13/2022 Document Reviewed: 01/13/2022 Elsevier Patient Education  2024  [employer].

## 2023-06-01 ENCOUNTER — Other Ambulatory Visit: Payer: Self-pay | Admitting: Cardiovascular Disease

## 2023-07-22 ENCOUNTER — Encounter: Payer: Self-pay | Admitting: Cardiovascular Disease

## 2023-08-09 IMAGING — DX DG CHEST 2V
2 series · 2 of 2 positions shown · non-contrast
Comparison: September 04, 2021

CLINICAL DATA: Chest pain.

EXAM:
CHEST - 2 VIEW

[chest pa]
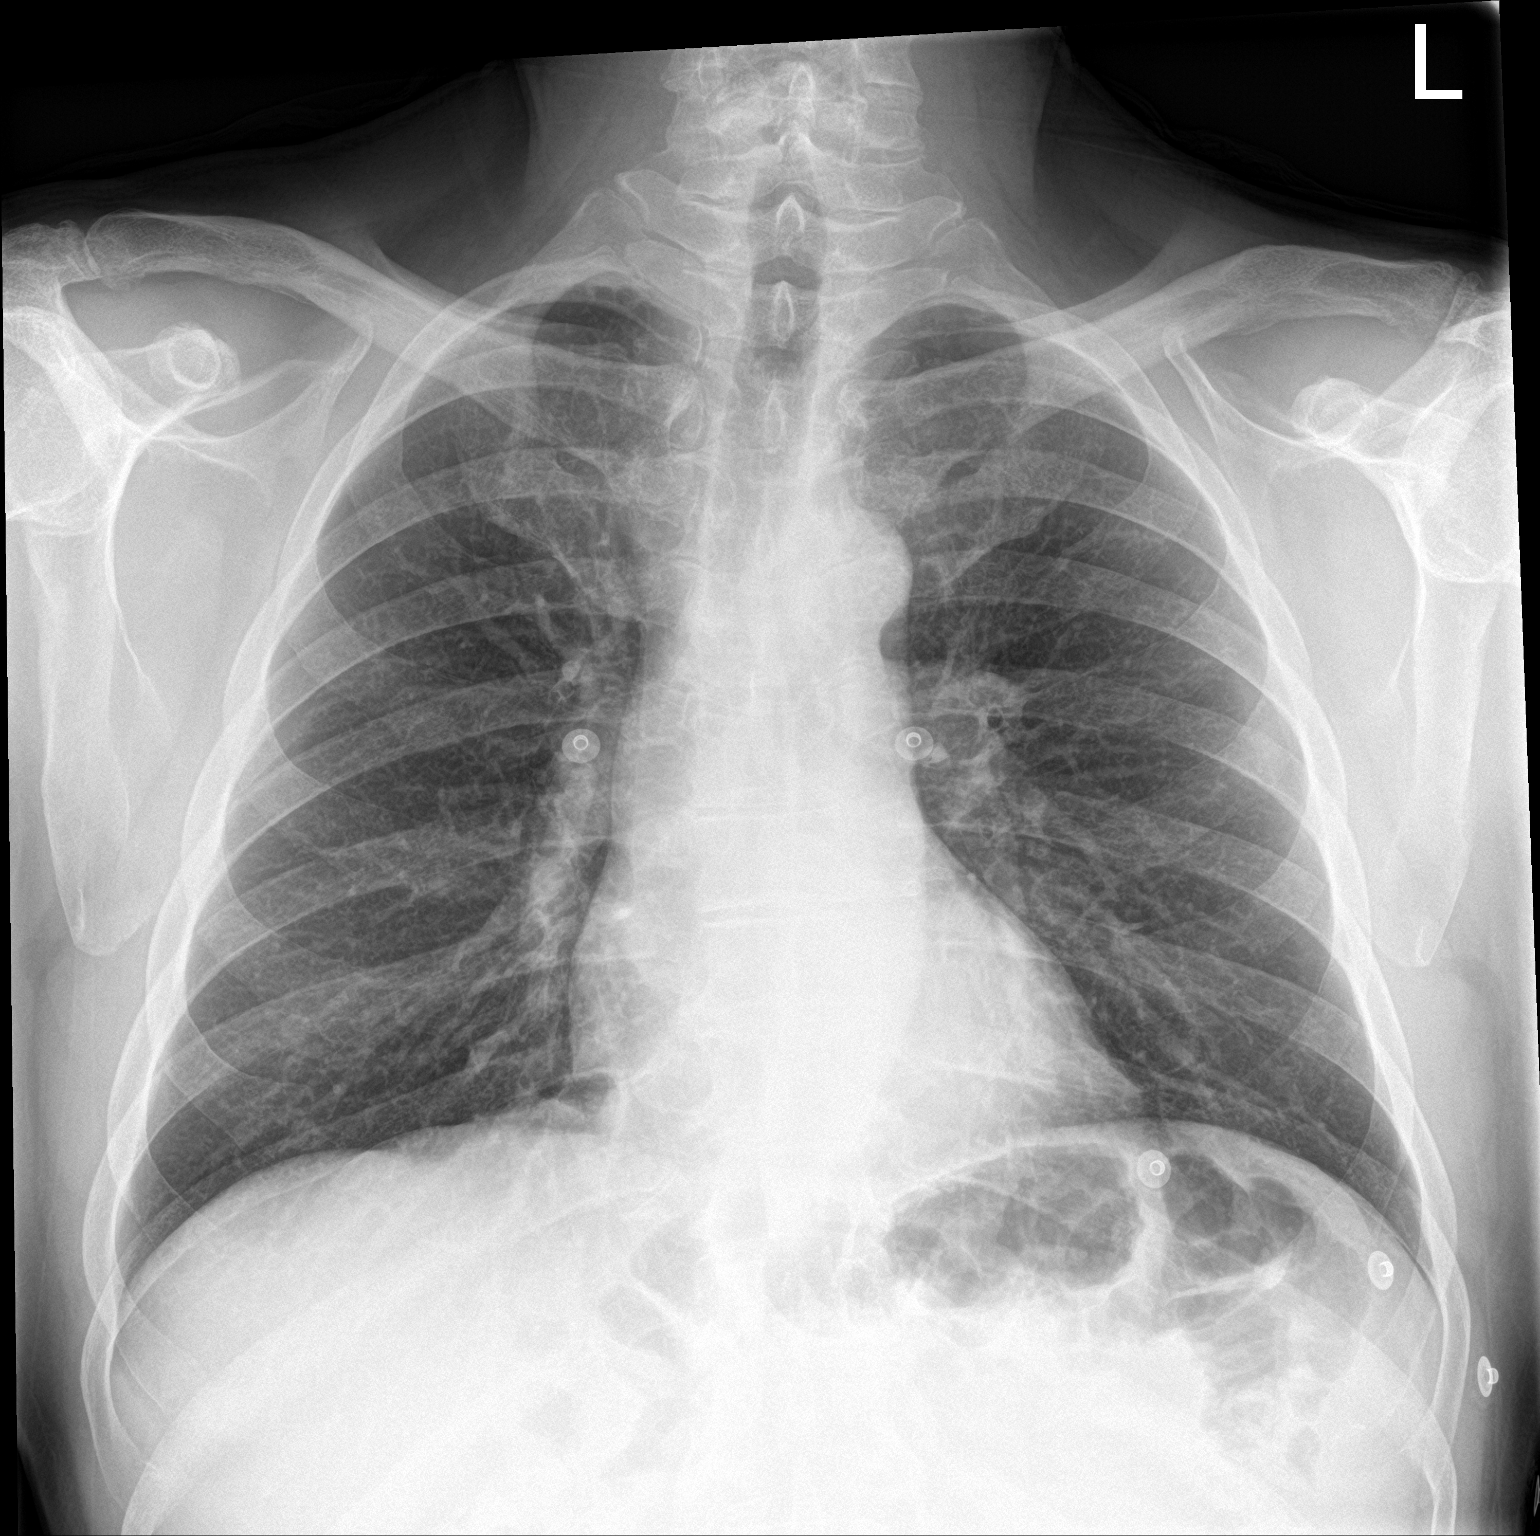

[chest lat]
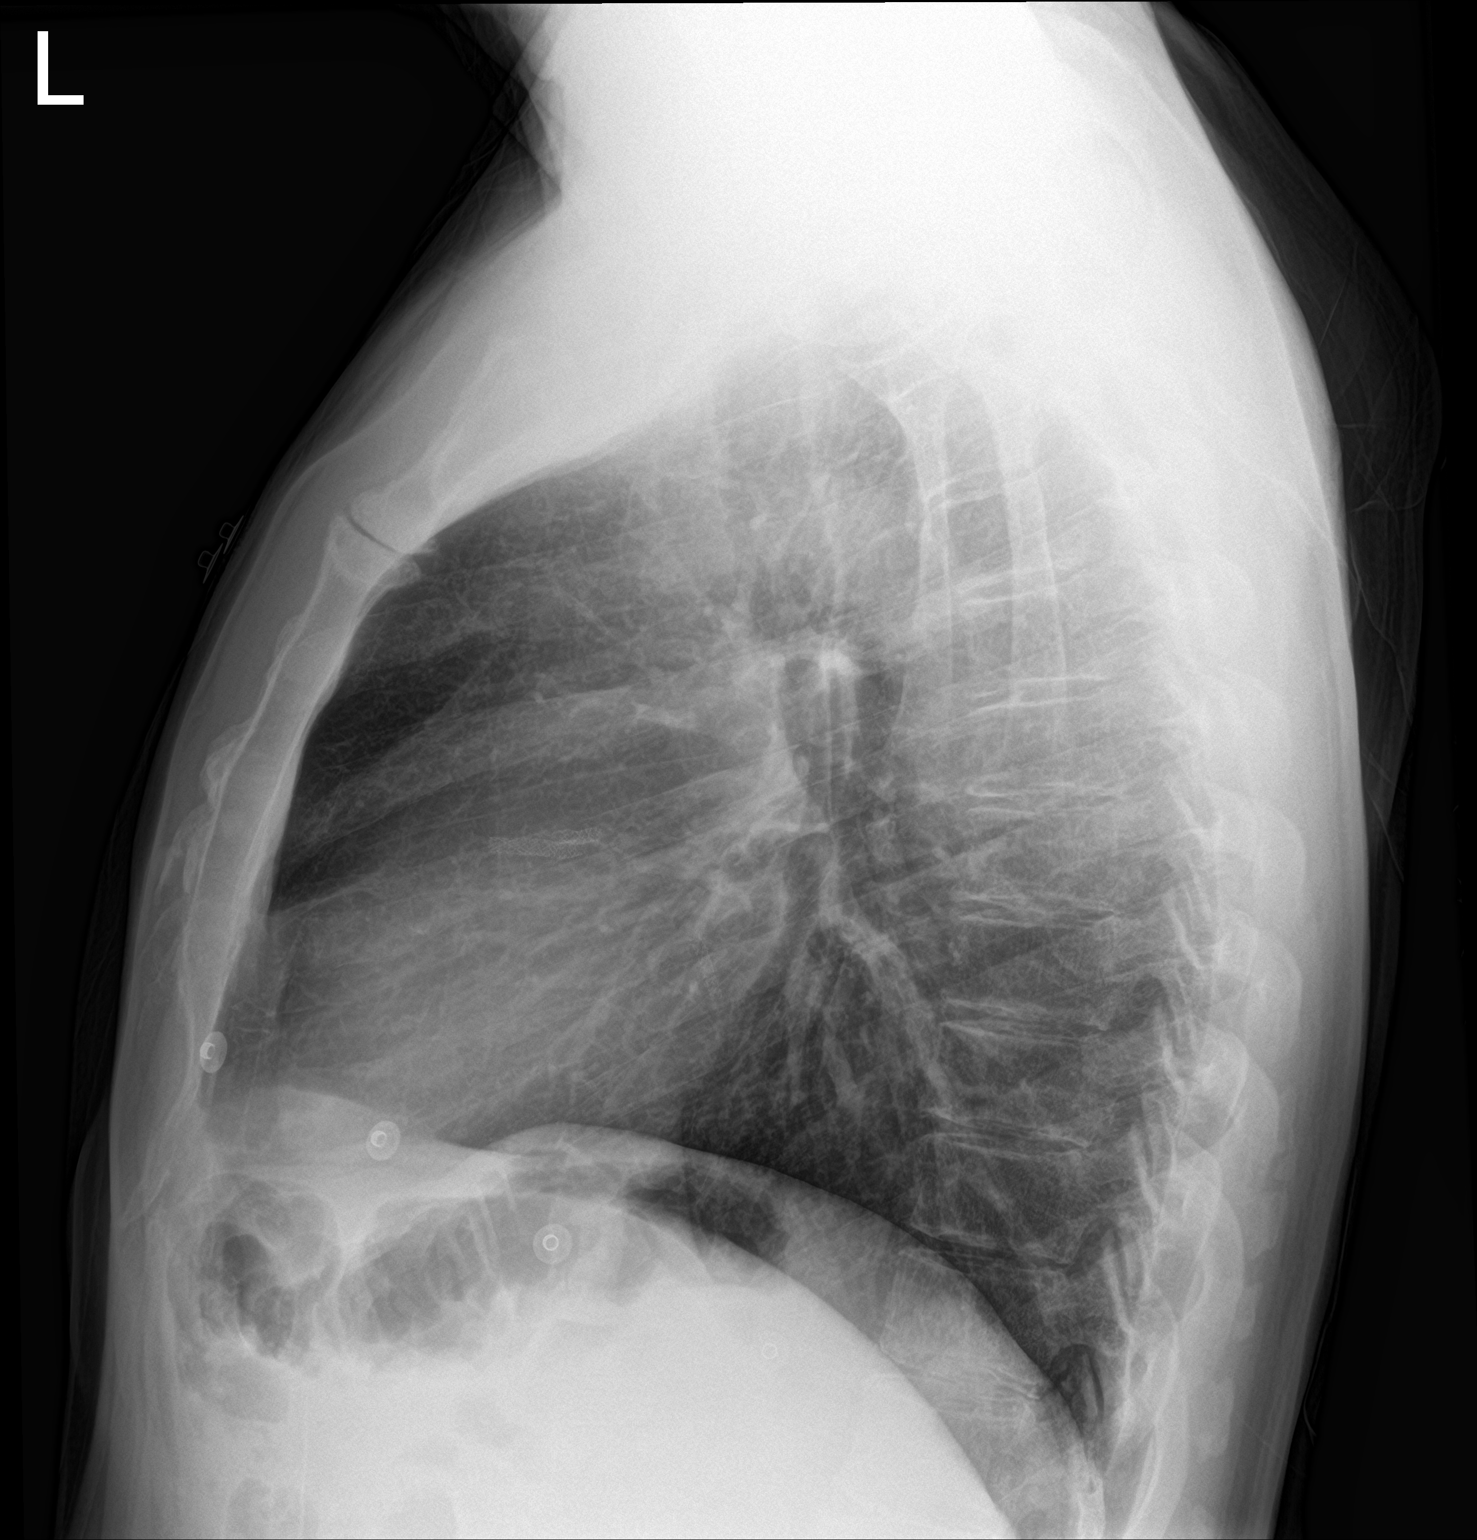

[2 of 2 positions shown; findings below may reference images not displayed]

FINDINGS: The heart size and mediastinal contours are within normal limits.
Coronary artery stents. No focal consolidation. No pleural effusion.
No pneumothorax. The visualized skeletal structures are
unremarkable.
IMPRESSION: No active cardiopulmonary disease.

## 2023-11-28 ENCOUNTER — Other Ambulatory Visit: Payer: Self-pay | Admitting: Cardiovascular Disease

## 2024-01-06 ENCOUNTER — Telehealth: Payer: Self-pay | Admitting: Cardiovascular Disease

## 2024-01-06 DIAGNOSIS — I2510 Atherosclerotic heart disease of native coronary artery without angina pectoris: Secondary | ICD-10-CM

## 2024-01-06 DIAGNOSIS — I10 Essential (primary) hypertension: Secondary | ICD-10-CM

## 2024-01-06 DIAGNOSIS — E785 Hyperlipidemia, unspecified: Secondary | ICD-10-CM

## 2024-01-06 NOTE — Telephone Encounter (Signed)
 Pt is calling wanting to have labs done before his appt in June

## 2024-01-07 NOTE — Telephone Encounter (Signed)
Called and spoke with patient. He didn't have labs done at PCP which was reported to [place] at last visit. Orders placed for CMET and lipids at this time and pt will go to [facility] at his convenience.

## 2024-01-11 ENCOUNTER — Emergency Department (HOSPITAL_BASED_OUTPATIENT_CLINIC_OR_DEPARTMENT_OTHER): Payer: 59

## 2024-01-11 ENCOUNTER — Other Ambulatory Visit: Payer: Self-pay

## 2024-01-11 ENCOUNTER — Emergency Department (HOSPITAL_BASED_OUTPATIENT_CLINIC_OR_DEPARTMENT_OTHER)
Admission: EM | Admit: 2024-01-11 | Discharge: 2024-01-11 | Disposition: A | Discharge status: Home / Self Care | Payer: 59

## 2024-01-11 ENCOUNTER — Encounter (HOSPITAL_BASED_OUTPATIENT_CLINIC_OR_DEPARTMENT_OTHER): Payer: Self-pay | Admitting: Emergency Medicine

## 2024-01-11 DIAGNOSIS — Z79899 Other long term (current) drug therapy: Secondary | ICD-10-CM | POA: Insufficient documentation

## 2024-01-11 DIAGNOSIS — R072 Precordial pain: Secondary | ICD-10-CM | POA: Diagnosis not present

## 2024-01-11 DIAGNOSIS — Z7982 Long term (current) use of aspirin: Secondary | ICD-10-CM | POA: Diagnosis not present

## 2024-01-11 DIAGNOSIS — I10 Essential (primary) hypertension: Secondary | ICD-10-CM | POA: Diagnosis not present

## 2024-01-11 DIAGNOSIS — R0789 Other chest pain: Secondary | ICD-10-CM | POA: Diagnosis present

## 2024-01-11 DIAGNOSIS — R42 Dizziness and giddiness: Secondary | ICD-10-CM | POA: Diagnosis not present

## 2024-01-11 LAB — CBC
HCT: 45.2 % (ref 39.0–52.0)
Hemoglobin: 15.5 g/dL (ref 13.0–17.0)
MCH: 29.8 pg (ref 26.0–34.0)
MCHC: 34.3 g/dL (ref 30.0–36.0)
MCV: 86.9 fL (ref 80.0–100.0)
Platelets: 274 10*3/uL (ref 150–400)
RBC: 5.2 MIL/uL (ref 4.22–5.81)
RDW: 11.8 % (ref 11.5–15.5)
WBC: 8.2 10*3/uL (ref 4.0–10.5)
nRBC: 0 % (ref 0.0–0.2)

## 2024-01-11 LAB — BASIC METABOLIC PANEL
Anion gap: 6 (ref 5–15)
BUN: 18 mg/dL (ref 8–23)
CO2: 24 mmol/L (ref 22–32)
Calcium: 9.5 mg/dL (ref 8.9–10.3)
Chloride: 105 mmol/L (ref 98–111)
Creatinine, Ser: 1.15 mg/dL (ref 0.61–1.24)
GFR, Estimated: >60 mL/min (ref >60)
Glucose, Bld: 115 mg/dL — ABNORMAL HIGH (ref 70–99)
Potassium: 4.3 mmol/L (ref 3.5–5.1)
Sodium: 135 mmol/L (ref 135–145)

## 2024-01-11 LAB — TROPONIN I (HIGH SENSITIVITY)
Troponin I (High Sensitivity): 2 ng/L (ref <18)
Troponin I (High Sensitivity): 3 ng/L (ref <18)

## 2024-01-11 NOTE — Discharge Instructions (Signed)
Please read and follow all provided instructions.  Your diagnoses today include:  1. Precordial pain     Tests performed today include: An EKG of your heart A chest x-ray Cardiac enzymes - a blood test for heart muscle damage, were both normal Blood counts and electrolytes Vital signs. See below for your results today.   Medications prescribed:  None  Take any prescribed medications only as directed.  Follow-up instructions: Please follow-up with your cardiologist as soon as you can for further evaluation of your symptoms.  I have placed a referral on your behalf.  Return instructions:  SEEK IMMEDIATE MEDICAL ATTENTION IF: You have severe chest pain, especially if the pain is crushing or pressure-like and spreads to the arms, back, neck, or jaw, or if you have sweating, nausea or vomiting, or trouble with breathing. THIS IS AN EMERGENCY. Do not wait to see if the pain will go away. Get medical help at once. Call 911. DO NOT drive yourself to the hospital.  Your chest pain gets worse and does not go away after a few minutes of rest.  You have an attack of chest pain lasting longer than what you usually experience.  You have significant dizziness, if you pass out, or have trouble walking.  You have chest pain not typical of your usual pain for which you originally saw your caregiver.  You have any other emergent concerns regarding your health.  Additional Information: Chest pain comes from many different causes. Your caregiver has diagnosed you as having chest pain that is not specific for one problem, but does not require admission.  You are at low risk for an acute heart condition or other serious illness.   Your vital signs today were: BP 136/72   Pulse (!) 54   Temp 98.5 F (36.9 C) (Oral)   Resp 18   Wt 90.7 kg   SpO2 100%   BMI 25.68 kg/m  If your blood pressure (BP) was elevated above 135/85 this visit, please have this repeated by your doctor within one  month. --------------

## 2024-01-11 NOTE — ED Provider Notes (Signed)
[place] [facility] Provider Note   CSN: [id number] Arrival date & time: 01/11/24  1103     History  Chief Complaint  Patient presents with   Chest Pain    Donald Jimenez is a 63 y.o. male.  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 Excell Seltzer --presents to the emergency department for 2 to 3 days of pressure in the left chest and mid chest.  Patient reports symptoms less intense than previous MI.  He does report returning to the gym last week and doing bench press.  Symptoms started after this.  He does have tenderness in the left pectoral area, but not on the right side.  No shortness of breath.  He did have some lightheadedness without syncope yesterday.  No neck or back pain.  No associated vomiting or diaphoresis.  Currently only on baby aspirin and atorvastatin.       Home Medications Prior to Admission medications   Medication Sig Start Date End Date Taking? Authorizing Provider  Ascorbic Acid (VITAMIN C) 100 MG tablet Take 100 mg by mouth daily.    [provider]  aspirin EC (ASPIRIN LOW DOSE) 81 MG tablet TAKE 1 TABLET BY MOUTH EVERY DAY 12/01/23   Tonny Bollman, MD  atorvastatin (LIPITOR) 80 MG tablet TAKE 1 TABLET BY MOUTH EVERY DAY 12/01/23   Tonny Bollman, MD  cholecalciferol (VITAMIN D3) 25 MCG (1000 UNIT) tablet Take 1,000 Units by mouth daily.    [provider]  nitroGLYCERIN (NITROSTAT) 0.4 MG SL tablet Place 1 tablet (0.4 mg total) under the tongue every 5 (five) minutes x 3 doses as needed for chest pain. 09/06/21   Tonny Bollman, MD  Omega-3 1000 MG CAPS Take 1 capsule by mouth daily.    [provider]  therapeutic multivitamin-minerals (THERAGRAN-M) tablet Take 1 tablet by mouth daily.    [provider]      Allergies    Patient has no known allergies.    Review of Systems   Review of Systems  Physical  Exam Updated Vital Signs BP (!) 132/91 (BP Location: Right Arm)   Pulse 79   Temp 98.5 F (36.9 C) (Oral)   Resp 19   Wt 90.7 kg   SpO2 100%   BMI 25.68 kg/m   Physical Exam Vitals and nursing note reviewed.  Constitutional:      Appearance: He is well-developed. He is not diaphoretic.  HENT:     Head: Normocephalic and atraumatic.     Mouth/Throat:     Mouth: Mucous membranes are not dry.  Eyes:     Conjunctiva/sclera: Conjunctivae normal.  Neck:     Vascular: Normal carotid pulses. No carotid bruit or JVD.     Trachea: Trachea normal. No tracheal deviation.  Cardiovascular:     Rate and Rhythm: Normal rate and regular rhythm.     Pulses: No decreased pulses.          Radial pulses are 2+ on the right side and 2+ on the left side.     Heart sounds: Normal heart sounds, S1 normal and S2 normal. Heart sounds not distant. No murmur heard. Pulmonary:     Effort: Pulmonary effort is normal. No respiratory distress.     Breath sounds: Normal breath sounds. No wheezing.  Chest:     Chest wall: Tenderness present.     Comments: Mild tenderness over the left anterior chest wall without  deformity Abdominal:     General: Bowel sounds are normal.     Palpations: Abdomen is soft.     Tenderness: There is no abdominal tenderness. There is no guarding or rebound.  Musculoskeletal:     Cervical back: Normal range of motion and neck supple. No muscular tenderness.     Right lower leg: No edema.     Left lower leg: No edema.  Skin:    General: Skin is warm and dry.     Coloration: Skin is not pale.  Neurological:     Mental Status: He is alert. Mental status is at baseline.  Psychiatric:        Mood and Affect: Mood normal.     ED Results / Procedures / Treatments   Labs (all labs ordered are listed, but only abnormal results are displayed) Labs Reviewed  BASIC METABOLIC PANEL - Abnormal; Notable for the following components:      Result Value   Glucose, Bld 115 (*)    All  other components within normal limits  CBC  TROPONIN I (HIGH SENSITIVITY)  TROPONIN I (HIGH SENSITIVITY)    EKG EKG Interpretation Date/Time:  Monday January 11 2024 11:11:17 EST Ventricular Rate:  77 PR Interval:  164 QRS Duration:  79 QT Interval:  354 QTC Calculation: 401 R Axis:   59  Text Interpretation: Sinus rhythm Abnormal R-wave progression, early transition Confirmed by Beckey Downing [phone]) on 01/11/2024 11:35:47 AM  Radiology DG Chest 2 View Result Date: 01/11/2024 CLINICAL DATA:  Chest pain. EXAM: CHEST - 2 VIEW COMPARISON:  Chest radiograph and CTA chest dated July 02, 2022. FINDINGS: The heart size and mediastinal contours are within normal limits. No focal consolidation, pleural effusion, or pneumothorax. No acute osseous abnormality. IMPRESSION: No acute cardiopulmonary findings. Electronically Signed   By: Hart Robinsons M.D.   On: 01/11/2024 12:18    Procedures Procedures    Medications Ordered in ED Medications - No data to display  ED Course/ Medical Decision Making/ A&P    Patient seen and examined. History obtained directly from patient.  Reviewed previous cardiology notes.  Labs/EKG: Ordered CBC, BMP, Trop.  EKG personally reviewed and interpreted as above.  Imaging: Ordered chest x-ray.  Medications/Fluids: None ordered  Most recent vital signs reviewed and are as follows: BP (!) 132/91 (BP Location: Right Arm)   Pulse 79   Temp 98.5 F (36.9 C) (Oral)   Resp 19   Wt 90.7 kg   SpO2 100%   BMI 25.68 kg/m   Initial impression: Atypical chest pain with some reproducibility.   12:27 PM Reassessment performed. Patient appears stable.  Updated on results at this point.  Labs personally reviewed and interpreted including: CBC unremarkable; BMP unremarkable; troponin normal at 2.  Pending second troponin.  Imaging personally visualized and interpreted including: X-ray of the chest, agree negative.  Reviewed pertinent lab work and imaging  with patient at bedside. Questions answered.  We discussed and reviewed CT imaging from 2023 that showed left lower lobe, tree-in-bud opacities.  Patient is not a smoker.  No evidence of these on x-ray today.  Encouraged outpatient follow-up.  He may not even need surveillance for these.  Most current vital signs reviewed and are as follows: BP (!) 132/91 (BP Location: Right Arm)   Pulse 79   Temp 98.5 F (36.9 C) (Oral)   Resp 19   Wt 90.7 kg   SpO2 100%   BMI 25.68 kg/m   Plan: Second troponin  in about 1 hour.  If workup is reassuring, will place request for follow-up appointment through epic to cardiology.  2:52 PM Reassessment performed. Patient appears stable, comfortable.  Minimal pressure at time of discharge, unchanged.  Labs personally reviewed and interpreted including: Second troponin negative.  Reviewed pertinent lab work and imaging with patient at bedside. Questions answered.   Most current vital signs reviewed and are as follows: BP 136/72   Pulse (!) 54   Temp 98.5 F (36.9 C) (Oral)   Resp 18   Wt 90.7 kg   SpO2 100%   BMI 25.68 kg/m   Plan: Discharge to home.   Prescriptions written for: None  Return and follow-up instructions: I encouraged patient to return to ED with severe chest pain, especially if the pain is crushing or pressure-like and spreads to the arms, back, neck, or jaw, or if they have associated sweating, vomiting, or shortness of breath with the pain, or significant pain with activity. We discussed that the evaluation here today indicates a low-risk of serious cause of chest pain, including heart trouble or a blood clot, but no evaluation is perfect and chest pain can evolve with time. The patient verbalized understanding and agreed.  I encouraged patient to follow-up with their provider in the next 48 hours for recheck.                                  Medical Decision Making Amount and/or Complexity of Data Reviewed Labs:  ordered. Radiology: ordered.   For this patient's complaint of chest pain, the following emergent conditions were considered on the differential diagnosis: acute coronary syndrome, pulmonary embolism, pneumothorax, myocarditis, pericardial tamponade, aortic dissection, thoracic aortic aneurysm complication, esophageal perforation.   Other causes were also considered including: gastroesophageal reflux disease, musculoskeletal pain including costochondritis, pneumonia/pleurisy, herpes zoster, pericarditis.  Patient has known history of ACS, status post stenting.  Chest pain today is atypical.  EKG is unchanged and does not show signs of ischemia.  Troponins are negative x 2.  In regards to possibility of PE, symptoms are atypical for PE and risk profile is low, making PE low likelihood.  The patient's vital signs, pertinent lab work and imaging were reviewed and interpreted as discussed in the ED course. Hospitalization was considered for further testing, treatments, or serial exams/observation. However as patient is well-appearing, has a stable exam, and reassuring studies today, I do not feel that they warrant admission at this time. This plan was discussed with the patient who verbalizes agreement and comfort with this plan and seems reliable and able to return to the Emergency Department with worsening or changing symptoms.          Final Clinical Impression(s) / ED Diagnoses Final diagnoses:  Precordial pain    Rx / DC Orders ED Discharge Orders          Ordered    Ambulatory referral to Cardiology       Comments: If you have not heard from the Cardiology office within the next 72 hours please call [phone].   01/11/24 1451              Renne Crigler, PA-C 01/11/24 1453    Durwin Glaze, MD 01/12/24 1630

## 2024-01-11 NOTE — ED Triage Notes (Signed)
Left chest pressure x 3 days , tender to touch , Hx  IMs and 3 stents  Denies shortness of breath , lightheadedness . Denies NV

## 2024-01-13 ENCOUNTER — Ambulatory Visit: Payer: 59 | Attending: Cardiovascular Disease | Admitting: Cardiovascular Disease

## 2024-01-13 VITALS — BP 138/80 | HR 68 | Ht 74.0 in | Wt 197.0 lb

## 2024-01-13 DIAGNOSIS — E785 Hyperlipidemia, unspecified: Secondary | ICD-10-CM | POA: Diagnosis not present

## 2024-01-13 DIAGNOSIS — R072 Precordial pain: Secondary | ICD-10-CM

## 2024-01-13 DIAGNOSIS — I10 Essential (primary) hypertension: Secondary | ICD-10-CM | POA: Diagnosis not present

## 2024-01-13 DIAGNOSIS — I25119 Atherosclerotic heart disease of native coronary artery with unspecified angina pectoris: Secondary | ICD-10-CM

## 2024-01-13 NOTE — Progress Notes (Signed)
Cardiology Office Note:    Date:  01/15/2024   ID:  Bryan Lemma, DOB 1961-12-20, MRN [id number]  PCP:  Joycelyn Rua, MD   [facility] Providers Cardiologist:  Tonny Bollman, MD     Referring MD: Renne Crigler, PA-C   Chief Complaint  Patient presents with   Coronary Artery Disease    History of Present Illness:    Donald Jimenez is a 63 y.o. male with a hx of:  Coronary artery disease STEMI 08/2021 >> s/p 3.5 x 26 mm DES to the pLAD; 2.75 x 15 mm DES to D1; 2.5 x 15 mm DES to the mLCx Cath 09/04/2021: Proximal LAD 75, D1 80; mid LCx 100; EF 55-65 Echocardiogram 09/04/2021: EF 55-60, no RWMA, GR 1 DD, normal RVSF, normal      PASP, trivial MR, trivial AI, RAP 3 ETT 12/05/2021: Ex 10"; negative test Hypertension  Hyperlipidemia  Chronic kidney disease   Patient is here alone today.  He has been experiencing some intermittent chest pain.  He was recently evaluated in the emergency department for chest pain and he had serial troponins that were normal with high-sensitivity troponin values of 2 and 3, respectively.  There were no significant abnormality seen on his EKG or chest x-ray and he was ultimately discharged home.  He has been having some back discomfort that reminds him of symptoms that he had before his MI.  He has not had any severe substernal chest pressure like he had at the time of his MI.  He complains of some left-sided chest pains.  No shortness of breath or heart palpitations.  No other complaints.  Current Medications: Current Meds  Medication Sig   Ascorbic Acid (VITAMIN C) 100 MG tablet Take 100 mg by mouth daily.   aspirin EC (ASPIRIN LOW DOSE) 81 MG tablet TAKE 1 TABLET BY MOUTH EVERY DAY   atorvastatin (LIPITOR) 80 MG tablet TAKE 1 TABLET BY MOUTH EVERY DAY   cholecalciferol (VITAMIN D3) 25 MCG (1000 UNIT) tablet Take 1,000 Units by mouth daily.   nitroGLYCERIN (NITROSTAT) 0.4 MG SL tablet Place 1 tablet (0.4 mg total) under the tongue every 5  (five) minutes x 3 doses as needed for chest pain.   Omega-3 1000 MG CAPS Take 1 capsule by mouth daily.   therapeutic multivitamin-minerals (THERAGRAN-M) tablet Take 1 tablet by mouth daily.     Allergies:   Patient has no known allergies.   ROS:   Please see the history of present illness.    All other systems reviewed and are negative.  EKGs/Labs/Other Studies Reviewed:    The following studies were reviewed today: Cardiac Studies & Procedures   CARDIAC CATHETERIZATION  CARDIAC CATHETERIZATION 09/04/2021  Narrative   Prox LAD to Mid LAD lesion is 75% stenosed.   Post intervention, there is a 0% residual stenosis.  1.  Total occlusion of the mid circumflex, treated with PCI using a 2.5 x 15 mm Onyx frontier DES 2.  Severe stenosis of the proximal LAD, treated with PCI using a 3.5 x 26 mm Onyx frontier DES 3.  Severe stenosis of the first diagonal branch of the LAD, treated with PCI using a 2.75 x 15 mm Onyx frontier DES 4.  Patent, dominant RCA with mild diffuse plaquing and no significant stenosis 5.  Normal LV systolic function with LVEF estimated at 55 to 65%  Recommendations: 2D echo, aggressive medical therapy with LDL goal less than 70 mg/dL, dual antiplatelet therapy with aspirin and Brilinta at  least 12 months, as long as no complications arise the patient should be eligible for hospital discharge tomorrow.  Findings Coronary Findings Diagnostic  Dominance: Right  Left Anterior Descending Prox LAD to Mid LAD lesion is 75% stenosed. The lesion is eccentric.  First Diagonal Branch 1st Diag lesion is 80% stenosed.  Left Circumflex Mid Cx lesion is 100% stenosed. Vessel is the culprit lesion. The lesion is heavily thrombotic.  Intervention  Prox LAD to Mid LAD lesion Stent Pre-stent angioplasty was not performed. A drug-eluting stent was successfully placed using a STENT ONYX FRONTIER 3.5X26. Maximum pressure: 16 atm. Post-stent angioplasty was performed using a  BALLOON SAPPHIRE [place] [id number]. Post-Intervention Lesion Assessment The intervention was successful. Pre-interventional TIMI flow is 3. Post-intervention TIMI flow is 3. No complications occurred at this lesion. There is a 0% residual stenosis post intervention.  1st Diag lesion Stent A drug-eluting stent was successfully placed using a STENT ONYX FRONTIER 2.75X15. Post-stent angioplasty was performed using a BALLOON SAPPHIRE [place] 3.0X12. Maximum pressure:  18 atm. Post-Intervention Lesion Assessment The intervention was successful. Pre-interventional TIMI flow is 3. Post-intervention TIMI flow is 3. No complications occurred at this lesion. There is a 0% residual stenosis post intervention.  Mid Cx lesion Stent CATH LAUNCHER 6FR EBU3.5 guide catheter was inserted. Lesion crossed with guidewire using a WIRE COUGAR XT STRL 190CM. Pre-stent angioplasty was performed. A drug-eluting stent was successfully placed using a STENT ONYX FRONTIER 2.5X15. Maximum pressure: 14 atm. Post-stent angioplasty was not performed. Post-Intervention Lesion Assessment The intervention was successful. Pre-interventional TIMI flow is 0. Post-intervention TIMI flow is 3. No complications occurred at this lesion. There is a 0% residual stenosis post intervention.   STRESS TESTS  EXERCISE TOLERANCE TEST (ETT) 12/05/2021  Narrative   No ST deviation was noted.   The patient walked on a standard Bruce protocol treadmill test for 10 minutes.  He achieved a peak heart rate of 150 which is 93% predicted maximal heart rate.   At peak exercise there were no ST or T wave changes.   There was no QRS widening at peak exercise.   There was a hypertensive response to exercise.   This is interpreted as a negative exercise test.  He has good exercise capacity.  ECHOCARDIOGRAM  ECHOCARDIOGRAM COMPLETE 09/04/2021  Narrative ECHOCARDIOGRAM REPORT    Patient Name:   Diar Berber Date of Exam: 09/04/2021 Medical Rec #:   [id number]    Height:       74.0 in Accession #:    [id number]   Weight:       222.4 lb Date of Birth:  [birth year]    BSA:          2.274 m Patient Age:    60 years     BP:           119/83 mmHg Patient Gender: M            HR:           62 bpm. Exam Location:  Inpatient  Procedure: 2D Echo  Indications:    NSTEMI  History:        Patient has no prior history of Echocardiogram examinations.  Sonographer:    Delcie Roch RDCS Referring Phys: [phone] Milus Fritze  IMPRESSIONS   1. Left ventricular ejection fraction, by estimation, is 55 to 60%. The left ventricle has normal function. The left ventricle has no regional wall motion abnormalities. Left ventricular diastolic parameters are consistent with Grade I diastolic dysfunction (  impaired relaxation). 2. Right ventricular systolic function is normal. The right ventricular size is normal. There is normal pulmonary artery systolic pressure. 3. The mitral valve is normal in structure. Trivial mitral valve regurgitation. No evidence of mitral stenosis. 4. The aortic valve is tricuspid. Aortic valve regurgitation is trivial. No aortic stenosis is present. 5. The inferior vena cava is normal in size with greater than 50% respiratory variability, suggesting right atrial pressure of 3 mmHg.  FINDINGS Left Ventricle: Left ventricular ejection fraction, by estimation, is 55 to 60%. The left ventricle has normal function. The left ventricle has no regional wall motion abnormalities. The left ventricular internal cavity size was normal in size. There is no left ventricular hypertrophy. Left ventricular diastolic parameters are consistent with Grade I diastolic dysfunction (impaired relaxation). Normal left ventricular filling pressure.  Right Ventricle: The right ventricular size is normal. No increase in right ventricular wall thickness. Right ventricular systolic function is normal. There is normal pulmonary artery systolic pressure. The  tricuspid regurgitant velocity is 1.87 m/s, and with an assumed right atrial pressure of 3 mmHg, the estimated right ventricular systolic pressure is 17.0 mmHg.  Left Atrium: Left atrial size was normal in size.  Right Atrium: Right atrial size was normal in size.  Pericardium: There is no evidence of pericardial effusion.  Mitral Valve: The mitral valve is normal in structure. Trivial mitral valve regurgitation. No evidence of mitral valve stenosis.  Tricuspid Valve: The tricuspid valve is normal in structure. Tricuspid valve regurgitation is trivial. No evidence of tricuspid stenosis.  Aortic Valve: The aortic valve is tricuspid. Aortic valve regurgitation is trivial. No aortic stenosis is present.  Pulmonic Valve: The pulmonic valve was normal in structure. Pulmonic valve regurgitation is not visualized. No evidence of pulmonic stenosis.  Aorta: The aortic root is normal in size and structure.  Venous: The inferior vena cava is normal in size with greater than 50% respiratory variability, suggesting right atrial pressure of 3 mmHg.  IAS/Shunts: No atrial level shunt detected by color flow Doppler.   LEFT VENTRICLE PLAX 2D LVIDd:         4.80 cm  Diastology LVIDs:         3.30 cm  LV e' medial:    7.94 cm/s LV PW:         1.00 cm  LV E/e' medial:  7.6 LV IVS:        1.00 cm  LV e' lateral:   9.68 cm/s LVOT diam:     2.20 cm  LV E/e' lateral: 6.2 LV SV:         71 LV SV Index:   31 LVOT Area:     3.80 cm   RIGHT VENTRICLE             IVC RV S prime:     12.50 cm/s  IVC diam: 1.60 cm TAPSE (M-mode): 2.4 cm  LEFT ATRIUM             Index       RIGHT ATRIUM           Index LA diam:        4.30 cm 1.89 cm/m  RA Area:     12.40 cm LA Vol (A2C):   57.0 ml 25.07 ml/m RA Volume:   28.10 ml  12.36 ml/m LA Vol (A4C):   38.4 ml 16.89 ml/m LA Biplane Vol: 47.7 ml 20.98 ml/m AORTIC VALVE LVOT Vmax:   87.40 cm/s LVOT Vmean:  55.900 cm/s LVOT VTI:    0.188 m  AORTA Ao Root  diam: 3.40 cm Ao Asc diam:  3.50 cm  MITRAL VALVE               TRICUSPID VALVE MV Area (PHT): 2.99 cm    TR Peak grad:   14.0 mmHg MV Decel Time: 254 msec    TR Vmax:        187.11 cm/s MV E velocity: 60.20 cm/s MV A velocity: 74.40 cm/s  SHUNTS MV E/A ratio:  0.81        Systemic VTI:  0.19 m Systemic Diam: 2.20 cm  Chilton Si MD Electronically signed by Chilton Si MD Signature Date/Time: 09/04/2021/11:17:16 PM    Final             EKG:        Recent Labs: 01/23/2023: ALT 35 01/11/2024: BUN 18; Creatinine, Ser 1.15; Hemoglobin 15.5; Platelets 274; Potassium 4.3; Sodium 135  Recent Lipid Panel    Component Value Date/Time   CHOL 138 10/21/2022 0829   TRIG 97 10/21/2022 0829   HDL 57 10/21/2022 0829   CHOLHDL 2.4 10/21/2022 0829   CHOLHDL 4.7 09/05/2021 0240   VLDL 30 09/05/2021 0240   LDLCALC 63 10/21/2022 0829   LDLDIRECT 45 10/23/2021 1026          Physical Exam:    VS:  BP 138/80 (BP Location: Left Arm)   Pulse 68   Ht 6\' 2"  (1.88 m)   Wt 197 lb (89.4 kg)   SpO2 97%   BMI 25.29 kg/m     Wt Readings from Last 3 Encounters:  01/13/24 197 lb (89.4 kg)  01/11/24 200 lb (90.7 kg)  05/19/23 210 lb 6.4 oz (95.4 kg)     GEN:  Well nourished, well developed in no acute distress HEENT: Normal NECK: No JVD; No carotid bruits LYMPHATICS: No lymphadenopathy CARDIAC: RRR, no murmurs, rubs, gallops RESPIRATORY:  Clear to auscultation without rales, wheezing or rhonchi  ABDOMEN: Soft, non-tender, non-distended MUSCULOSKELETAL:  No edema; No deformity  SKIN: Warm and dry NEUROLOGIC:  Alert and oriented x 3 PSYCHIATRIC:  Normal affect   Assessment & Plan Coronary artery disease involving native coronary artery of native heart with angina pectoris (HCC) Continue current medical therapy.  Check cardiac PET stress test to evaluate for ischemia. Hyperlipidemia LDL goal <70 We discussed pros and cons of statin use.  The patient has reservations about  taking high intensity statin drug.  After our discussion today, he elects to continue on his current medication with atorvastatin 80 mg daily.  I think with his cardiac history, multivessel CAD, and history of MI.  It is appropriate to be aggressive with his lipid lowering and aim for an LDL cholesterol less than 55 mg/dL.  Last lipids on file showed an LDL of 63. Essential hypertension Blood pressure is controlled, continue current medications Precordial pain We discussed differential diagnoses today.  Suspicious for musculoskeletal pain.  However, recommend cardiac stress PET for ischemic evaluation.      Informed Consent   Shared Decision Making/Informed Consent The risks [chest pain, shortness of breath, cardiac arrhythmias, dizziness, blood pressure fluctuations, myocardial infarction, stroke/transient ischemic attack, nausea, vomiting, allergic reaction, radiation exposure, metallic taste sensation and life-threatening complications (estimated to be 1 in 10,000)], benefits (risk stratification, diagnosing coronary artery disease, treatment guidance) and alternatives of a cardiac PET stress test were discussed in detail with Mr. Einstein and he agrees to proceed.  Medication Adjustments/Labs and Tests Ordered: Current medicines are reviewed at length with the patient today.  Concerns regarding medicines are outlined above.  Orders Placed This Encounter  Procedures   NM PET CT CARDIAC PERFUSION MULTI W/ABSOLUTE BLOODFLOW   No orders of the defined types were placed in this encounter.   Patient Instructions  Medication Instructions:  Your physician recommends that you continue on your current medications as directed. Please refer to the Current Medication list given to you today.  *If you need a refill on your cardiac medications before your next appointment, please call your pharmacy*   Lab Work: NONE  If you have labs (blood work) drawn today and your tests are completely  normal, you will receive your results only by: MyChart Message (if you have MyChart) OR A paper copy in the mail If you have any lab test that is abnormal or we need to change your treatment, we will call you to review the results.   Testing/Procedures: Your physician has requested that you have a Stress Cardiac PET Scan.    Follow-Up: At [facility], you and your health needs are our priority.  As part of our continuing mission to provide you with exceptional heart care, we have created designated Provider Care Teams.  These Care Teams include your primary Cardiologist (physician) and Advanced Practice Providers (APPs -  Physician Assistants and Nurse Practitioners) who all work together to provide you with the care you need, when you need it.   Your next appointment:   6 month(s)  Provider:   Ronie Spies, PA-C, Robin Searing, NP, Jacolyn Reedy, PA-C, Tereso Newcomer, PA-C, or Perlie Gold, PA-C        Other Instructions   Please report to Radiology at the [facility] Main Entrance 30 minutes early for your test.  [address] [place], [place] [address]                         OR   Please report to Radiology at [facility] Main Entrance, medical mall, 30 mins prior to your test.  [address]  [place], [place]  How to Prepare for Your Cardiac PET/CT Stress Test:  Nothing to eat or drink, except water, 3 hours prior to arrival time.  NO caffeine/decaffeinated products, or chocolate 12 hours prior to arrival. (Please note decaffeinated beverages (teas/coffees) still contain caffeine).  If you have caffeine within 12 hours prior, the test will need to be rescheduled.  Medication instructions: Do not take erectile dysfunction medications for 72 hours prior to test (sildenafil, tadalafil) Do not take nitrates (isosorbide mononitrate, Ranexa) the day before or day of test Do not take tamsulosin the day before or morning of test Hold  theophylline containing medications for 12 hours. Hold Dipyridamole 48 hours prior to the test.  Diabetic Preparation: If able to eat breakfast prior to 3 hour fasting, you may take all medications, including your insulin. Do not worry if you miss your breakfast dose of insulin - start at your next meal. If you do not eat prior to 3 hour fast-Hold all diabetes (oral and insulin) medications. Patients who wear a continuous glucose monitor MUST remove the device prior to scanning.  You may take your remaining medications with water.  NO perfume, cologne or lotion on chest or abdomen area. FEMALES - Please avoid wearing dresses to this appointment.  Total time is 1 to 2 hours; you may want  to bring reading material for the waiting time.  IF YOU THINK YOU MAY BE PREGNANT, OR ARE NURSING PLEASE INFORM THE TECHNOLOGIST.  In preparation for your appointment, medication and supplies will be purchased.  Appointment availability is limited, so if you need to cancel or reschedule, please call the Radiology Department Scheduler at [phone] 24 hours in advance to avoid a cancellation fee of $100.00  What to Expect When you Arrive:  Once you arrive and check in for your appointment, you will be taken to a preparation room within the Radiology Department.  A technologist or Nurse will obtain your medical history, verify that you are correctly prepped for the exam, and explain the procedure.  Afterwards, an IV will be started in your arm and electrodes will be placed on your skin for EKG monitoring during the stress portion of the exam. Then you will be escorted to the PET/CT scanner.  There, staff will get you positioned on the scanner and obtain a blood pressure and EKG.  During the exam, you will continue to be connected to the EKG and blood pressure machines.  A small, safe amount of a radioactive tracer will be injected in your IV to obtain a series of pictures of your heart along with an injection of  a stress agent.    After your Exam:  It is recommended that you eat a meal and drink a caffeinated beverage to counter act any effects of the stress agent.  Drink plenty of fluids for the remainder of the day and urinate frequently for the first couple of hours after the exam.  Your doctor will inform you of your test results within 7-10 business days.  For more information and frequently asked questions, please visit our website: [email or web address]  For questions about your test or how to prepare for your test, please call: Cardiac Imaging Nurse Navigators Office: [phone]          Signed, Tonny Bollman, MD  01/15/2024 5:17 PM    [facility]

## 2024-01-13 NOTE — Patient Instructions (Signed)
Medication Instructions:  Your physician recommends that you continue on your current medications as directed. Please refer to the Current Medication list given to you today.  *If you need a refill on your cardiac medications before your next appointment, please call your pharmacy*   Lab Work: NONE  If you have labs (blood work) drawn today and your tests are completely normal, you will receive your results only by: MyChart Message (if you have MyChart) OR A paper copy in the mail If you have any lab test that is abnormal or we need to change your treatment, we will call you to review the results.   Testing/Procedures: Your physician has requested that you have a Stress Cardiac PET Scan.    Follow-Up: At [facility], you and your health needs are our priority.  As part of our continuing mission to provide you with exceptional heart care, we have created designated Provider Care Teams.  These Care Teams include your primary Cardiologist (physician) and Advanced Practice Providers (APPs -  Physician Assistants and Nurse Practitioners) who all work together to provide you with the care you need, when you need it.   Your next appointment:   6 month(s)  Provider:   Ronie Spies, PA-C, Robin Searing, NP, Jacolyn Reedy, PA-C, Tereso Newcomer, PA-C, or Perlie Gold, PA-C        Other Instructions   Please report to Radiology at the [facility] Main Entrance 30 minutes early for your test.  [address] [place], [place] [address]                         OR   Please report to Radiology at [facility] Main Entrance, medical mall, 30 mins prior to your test.  [address]  [place], [place]  How to Prepare for Your Cardiac PET/CT Stress Test:  Nothing to eat or drink, except water, 3 hours prior to arrival time.  NO caffeine/decaffeinated products, or chocolate 12 hours prior to arrival. (Please note decaffeinated beverages (teas/coffees)  still contain caffeine).  If you have caffeine within 12 hours prior, the test will need to be rescheduled.  Medication instructions: Do not take erectile dysfunction medications for 72 hours prior to test (sildenafil, tadalafil) Do not take nitrates (isosorbide mononitrate, Ranexa) the day before or day of test Do not take tamsulosin the day before or morning of test Hold theophylline containing medications for 12 hours. Hold Dipyridamole 48 hours prior to the test.  Diabetic Preparation: If able to eat breakfast prior to 3 hour fasting, you may take all medications, including your insulin. Do not worry if you miss your breakfast dose of insulin - start at your next meal. If you do not eat prior to 3 hour fast-Hold all diabetes (oral and insulin) medications. Patients who wear a continuous glucose monitor MUST remove the device prior to scanning.  You may take your remaining medications with water.  NO perfume, cologne or lotion on chest or abdomen area. FEMALES - Please avoid wearing dresses to this appointment.  Total time is 1 to 2 hours; you may want to bring reading material for the waiting time.  IF YOU THINK YOU MAY BE PREGNANT, OR ARE NURSING PLEASE INFORM THE TECHNOLOGIST.  In preparation for your appointment, medication and supplies will be purchased.  Appointment availability is limited, so if you need to cancel or reschedule, please call the Radiology Department Scheduler at [phone] 24 hours  in advance to avoid a cancellation fee of $100.00  What to Expect When you Arrive:  Once you arrive and check in for your appointment, you will be taken to a preparation room within the Radiology Department.  A technologist or Nurse will obtain your medical history, verify that you are correctly prepped for the exam, and explain the procedure.  Afterwards, an IV will be started in your arm and electrodes will be placed on your skin for EKG monitoring during the stress portion of the  exam. Then you will be escorted to the PET/CT scanner.  There, staff will get you positioned on the scanner and obtain a blood pressure and EKG.  During the exam, you will continue to be connected to the EKG and blood pressure machines.  A small, safe amount of a radioactive tracer will be injected in your IV to obtain a series of pictures of your heart along with an injection of a stress agent.    After your Exam:  It is recommended that you eat a meal and drink a caffeinated beverage to counter act any effects of the stress agent.  Drink plenty of fluids for the remainder of the day and urinate frequently for the first couple of hours after the exam.  Your doctor will inform you of your test results within 7-10 business days.  For more information and frequently asked questions, please visit our website: [email or web address]  For questions about your test or how to prepare for your test, please call: Cardiac Imaging Nurse Navigators Office: [phone]

## 2024-01-15 ENCOUNTER — Encounter: Payer: Self-pay | Admitting: Cardiovascular Disease

## 2024-03-25 ENCOUNTER — Other Ambulatory Visit (HOSPITAL_BASED_OUTPATIENT_CLINIC_OR_DEPARTMENT_OTHER): Payer: Self-pay | Admitting: Family Medicine

## 2024-03-25 DIAGNOSIS — R221 Localized swelling, mass and lump, neck: Secondary | ICD-10-CM

## 2024-03-28 ENCOUNTER — Ambulatory Visit (HOSPITAL_BASED_OUTPATIENT_CLINIC_OR_DEPARTMENT_OTHER)

## 2024-03-28 ENCOUNTER — Ambulatory Visit (HOSPITAL_BASED_OUTPATIENT_CLINIC_OR_DEPARTMENT_OTHER)
Admission: RE | Admit: 2024-03-28 | Discharge: 2024-03-28 | Disposition: A | Discharge status: Home / Self Care | Source: Ambulatory Visit | Attending: Family Medicine | Admitting: Family Medicine

## 2024-03-28 DIAGNOSIS — R221 Localized swelling, mass and lump, neck: Secondary | ICD-10-CM | POA: Insufficient documentation

## 2024-03-31 ENCOUNTER — Encounter (HOSPITAL_COMMUNITY): Payer: Self-pay

## 2024-04-05 ENCOUNTER — Encounter (HOSPITAL_COMMUNITY)
Admission: RE | Admit: 2024-04-05 | Discharge: 2024-04-05 | Disposition: A | Discharge status: Home / Self Care | Payer: 59 | Source: Ambulatory Visit | Attending: Cardiovascular Disease | Admitting: Cardiovascular Disease

## 2024-04-05 ENCOUNTER — Telehealth: Payer: Self-pay | Admitting: Cardiovascular Disease

## 2024-04-05 DIAGNOSIS — R072 Precordial pain: Secondary | ICD-10-CM | POA: Diagnosis not present

## 2024-04-05 LAB — NM PET CT CARDIAC PERFUSION MULTI W/ABSOLUTE BLOODFLOW
LV dias vol: 109 mL (ref 62–150)
LV sys vol: 50 mL
MBFR: 2.62
Nuc Rest EF: 54 %
Nuc Stress EF: 65 %
Rest MBF: 0.79 ml/g/min
Rest Nuclear Isotope Dose: 23.3 mCi
ST Depression (mm): 0 mm
Stress MBF: 2.07 ml/g/min
Stress Nuclear Isotope Dose: 23.3 mCi

## 2024-04-05 MED ORDER — REGADENOSON 0.4 MG/5ML IV SOLN
INTRAVENOUS | Status: AC
  Filled 2024-04-05: qty 5

## 2024-04-05 MED ORDER — RUBIDIUM RB82 GENERATOR (RUBYFILL): 23.3000 | PACK | Freq: Once | INTRAVENOUS | Status: DC

## 2024-04-05 MED ORDER — DEXTROSE 5 % IV SOLN
INTRAVENOUS | Status: AC
  Filled 2024-04-05: qty 50

## 2024-04-05 MED ORDER — REGADENOSON 0.4 MG/5ML IV SOLN
0.4000 mg | Freq: Once | INTRAVENOUS | Status: AC
  Administered 2024-04-05: 0.4 mg via INTRAVENOUS

## 2024-04-05 MED ORDER — CAFFEINE CITRATE BASE COMPONENT 10 MG/ML IV SOLN
INTRAVENOUS | Status: AC
Start: 2024-04-05 — End: ?
  Filled 2024-04-05: qty 3

## 2024-04-05 MED ORDER — RUBIDIUM RB82 GENERATOR (RUBYFILL)
23.3000 | PACK | Freq: Once | INTRAVENOUS | Status: DC
Start: 2024-04-05 — End: 2024-04-11

## 2024-04-05 NOTE — Telephone Encounter (Signed)
 Patient would like a call back to discuss PET [place] results.

## 2024-04-05 NOTE — Telephone Encounter (Signed)
 The patient has been notified of the result and verbalized understanding.  All questions (if any) were answered. Isobel Marie, RN 04/05/2024 12:54 PM

## 2024-04-05 NOTE — Progress Notes (Signed)
 Tolerated test well

## 2024-04-07 ENCOUNTER — Other Ambulatory Visit (HOSPITAL_BASED_OUTPATIENT_CLINIC_OR_DEPARTMENT_OTHER): Payer: Self-pay | Admitting: Family Medicine

## 2024-04-07 ENCOUNTER — Encounter (HOSPITAL_BASED_OUTPATIENT_CLINIC_OR_DEPARTMENT_OTHER): Payer: Self-pay | Admitting: Family Medicine

## 2024-04-07 DIAGNOSIS — R221 Localized swelling, mass and lump, neck: Secondary | ICD-10-CM

## 2024-04-13 ENCOUNTER — Ambulatory Visit (HOSPITAL_BASED_OUTPATIENT_CLINIC_OR_DEPARTMENT_OTHER)
Admission: RE | Admit: 2024-04-13 | Discharge: 2024-04-13 | Disposition: A | Discharge status: Home / Self Care | Source: Ambulatory Visit | Attending: Family Medicine | Admitting: Family Medicine

## 2024-04-13 DIAGNOSIS — R221 Localized swelling, mass and lump, neck: Secondary | ICD-10-CM | POA: Diagnosis present

## 2024-04-13 MED ORDER — IOHEXOL 300 MG/ML  SOLN
75.0000 mL | Freq: Once | INTRAMUSCULAR | Status: AC | PRN
  Administered 2024-04-13: 75 mL via INTRAVENOUS

## 2024-04-26 ENCOUNTER — Other Ambulatory Visit: Payer: Self-pay | Admitting: Cardiovascular Disease

## 2024-05-30 ENCOUNTER — Encounter: Payer: Self-pay | Admitting: Cardiovascular Disease

## 2024-05-30 ENCOUNTER — Ambulatory Visit: Payer: 59 | Attending: Cardiovascular Disease | Admitting: Cardiovascular Disease

## 2024-05-30 VITALS — BP 130/100 | HR 63 | Ht 74.0 in | Wt 199.0 lb

## 2024-05-30 DIAGNOSIS — E782 Mixed hyperlipidemia: Secondary | ICD-10-CM

## 2024-05-30 DIAGNOSIS — I25119 Atherosclerotic heart disease of native coronary artery with unspecified angina pectoris: Secondary | ICD-10-CM

## 2024-05-30 DIAGNOSIS — I10 Essential (primary) hypertension: Secondary | ICD-10-CM

## 2024-05-30 NOTE — Progress Notes (Signed)
 Cardiology Office Note:    Date:  05/30/2024   ID:  Donald Jimenez, DOB 27-Jan-1961, MRN [id number]  PCP:  Wyn Heater, MD   [facility] Providers Cardiologist:  Arnoldo Lapping, MD     Referring MD: Wyn Heater, MD   Chief Complaint  Patient presents with   Coronary Artery Disease    History of Present Illness:    Donald Jimenez is a 63 y.o. male with a hx of:  Coronary artery disease STEMI 08/2021 >> s/p 3.5 x 26 mm DES to the pLAD; 2.75 x 15 mm DES to D1; 2.5 x 15 mm DES to the mLCx Cath 09/04/2021: Proximal LAD 75, D1 80; mid LCx 100; EF 55-65 Echocardiogram 09/04/2021: EF 55-60, no RWMA, GR 1 DD, normal RVSF, normal      PASP, trivial MR, trivial AI, RAP 3 ETT 12/05/2021: Ex 10"; negative test Hypertension  Hyperlipidemia  Chronic kidney disease   The patient is here alone today. He is exercising at the gym at least 3 days/week. He walks in the mornings for 30 minutes with no exertional chest pain or pressure. States that BP measurements at the gym have 120-130's/70-80's, also with home readings in this range.  He has no other complaints this morning.  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  He works Monday through Friday but has options to work on weekends if he wants to.  He denies dyspnea, edema, heart palpitations, orthopnea, or PND.  Current Medications: Current Meds  Medication Sig   Ascorbic Acid (VITAMIN C) 100 MG tablet Take 100 mg by mouth daily.   ASPIRIN  LOW DOSE 81 MG tablet TAKE 1 TABLET BY MOUTH EVERY DAY   atorvastatin  (LIPITOR ) 80 MG tablet TAKE 1 TABLET BY MOUTH EVERY DAY   cholecalciferol (VITAMIN D3) 25 MCG (1000 UNIT) tablet Take 1,000 Units by mouth daily.   nitroGLYCERIN  (NITROSTAT ) 0.4 MG SL tablet Place 1 tablet (0.4 mg total) under the tongue every 5 (five) minutes x 3 doses as needed for chest pain.   Omega-3 1000 MG CAPS Take 1 capsule by mouth daily.   therapeutic  multivitamin-minerals (THERAGRAN-M) tablet Take 1 tablet by mouth daily.     Allergies:   Patient has no known allergies.   ROS:   Please see the history of present illness.    All other systems reviewed and are negative.  EKGs/Labs/Other Studies Reviewed:    The following studies were reviewed today: Cardiac Studies & Procedures   ______________________________________________________________________________________________ CARDIAC CATHETERIZATION  CARDIAC CATHETERIZATION 09/04/2021  Conclusion   Prox LAD to Mid LAD lesion is 75% stenosed.   Post intervention, there is a 0% residual stenosis.  1.  Total occlusion of the mid circumflex, treated with PCI using a 2.5 x 15 mm Onyx frontier DES 2.  Severe stenosis of the proximal LAD, treated with PCI using a 3.5 x 26 mm Onyx frontier DES 3.  Severe stenosis of the first diagonal branch of the LAD, treated with PCI using a 2.75 x 15 mm Onyx frontier DES 4.  Patent, dominant RCA with mild diffuse plaquing and no significant stenosis 5.  Normal LV systolic function with LVEF estimated at 55 to 65%  Recommendations: 2D echo, aggressive medical therapy with LDL goal less than 70 mg/dL, dual antiplatelet therapy with aspirin  and Brilinta  at least 12 months, as long as no complications arise the patient should be eligible for hospital discharge tomorrow.  Findings  Coronary Findings Diagnostic  Dominance: Right  Left Anterior Descending Prox LAD to Mid LAD lesion is 75% stenosed. The lesion is eccentric.  First Diagonal Branch 1st Diag lesion is 80% stenosed.  Left Circumflex Mid Cx lesion is 100% stenosed. Vessel is the culprit lesion. The lesion is heavily thrombotic.  Intervention  Prox LAD to Mid LAD lesion Stent Pre-stent angioplasty was not performed. A drug-eluting stent was successfully placed using a STENT ONYX FRONTIER 3.5X26. Maximum pressure: 16 atm. Post-stent angioplasty was performed using a BALLOON SAPPHIRE Peters  [id number]. Post-Intervention Lesion Assessment The intervention was successful. Pre-interventional TIMI flow is 3. Post-intervention TIMI flow is 3. No complications occurred at this lesion. There is a 0% residual stenosis post intervention.  1st Diag lesion Stent A drug-eluting stent was successfully placed using a STENT ONYX FRONTIER 2.75X15. Post-stent angioplasty was performed using a BALLOON SAPPHIRE  3.0X12. Maximum pressure:  18 atm. Post-Intervention Lesion Assessment The intervention was successful. Pre-interventional TIMI flow is 3. Post-intervention TIMI flow is 3. No complications occurred at this lesion. There is a 0% residual stenosis post intervention.  Mid Cx lesion Stent CATH LAUNCHER 6FR EBU3.5 guide catheter was inserted. Lesion crossed with guidewire using a WIRE COUGAR XT STRL 190CM. Pre-stent angioplasty was performed. A drug-eluting stent was successfully placed using a STENT ONYX FRONTIER 2.5X15. Maximum pressure: 14 atm. Post-stent angioplasty was not performed. Post-Intervention Lesion Assessment The intervention was successful. Pre-interventional TIMI flow is 0. Post-intervention TIMI flow is 3. No complications occurred at this lesion. There is a 0% residual stenosis post intervention.   STRESS TESTS  NM PET CT CARDIAC PERFUSION MULTI W/ABSOLUTE BLOODFLOW 04/05/2024  Narrative   LV perfusion is normal. There is no evidence of ischemia. There is no evidence of infarction.   Rest left ventricular function is normal. Rest EF: 54%. Stress left ventricular function is normal. Stress EF: 65%. End diastolic cavity size is normal. End systolic cavity size is normal.   Myocardial blood flow was computed to be 0.32ml/g/min at rest and 2.07ml/g/min at stress. Global myocardial blood flow reserve was 2.62 and was normal.   Coronary calcium  assessment not performed due to prior revascularization. Prior stents   The study is normal. The study is low risk.  CLINICAL DATA:   This over-read does not include interpretation of cardiac or coronary anatomy or pathology. The Cardiac PET CT interpretation by the cardiologist is attached.  COMPARISON:  None Available.  FINDINGS: Cardiovascular: Aortic atherosclerosis. Normal heart size. Three-vessel coronary artery calcifications and stents. No pericardial effusion.  Limited Mediastinum/Nodes: No enlarged mediastinal, hilar, or axillary lymph nodes. Trachea and esophagus demonstrate no significant findings.  Limited Lungs/Pleura: Mild diffuse bilateral bronchial wall thickening and mosaic attenuation of the airspaces. No pleural effusion or pneumothorax.  Upper Abdomen: No acute abnormality.  Musculoskeletal: No chest wall abnormality. No acute osseous findings.  IMPRESSION: 1. Mild diffuse bilateral bronchial wall thickening and mosaic attenuation of the airspaces, consistent with small airways disease. 2. Coronary artery disease.  Aortic Atherosclerosis (ICD10-I70.0).   Electronically Signed By: Fredricka Jenny M.D. On: 04/05/2024 08:00   ECHOCARDIOGRAM  ECHOCARDIOGRAM COMPLETE 09/04/2021  Narrative ECHOCARDIOGRAM REPORT    Patient Name:   Alandis Marte Date of Exam: 09/04/2021 Medical Rec #:  [id number]    Height:       74.0 in Accession #:    [id number]   Weight:       222.4 lb Date of Birth:  1961-03-24    BSA:  2.274 m Patient Age:    60 years     BP:           119/83 mmHg Patient Gender: M            HR:           62 bpm. Exam Location:  Inpatient  Procedure: 2D Echo  Indications:    NSTEMI  History:        Patient has no prior history of Echocardiogram examinations.  Sonographer:    Dione Franks RDCS Referring Phys: [id number] Hayat [place]  IMPRESSIONS   1. Left ventricular ejection fraction, by estimation, is 55 to 60%. The left ventricle has normal function. The left ventricle has no regional wall motion abnormalities. Left ventricular diastolic parameters are  consistent with Grade I diastolic dysfunction (impaired relaxation). 2. Right ventricular systolic function is normal. The right ventricular size is normal. There is normal pulmonary artery systolic pressure. 3. The mitral valve is normal in structure. Trivial mitral valve regurgitation. No evidence of mitral stenosis. 4. The aortic valve is tricuspid. Aortic valve regurgitation is trivial. No aortic stenosis is present. 5. The inferior vena cava is normal in size with greater than 50% respiratory variability, suggesting right atrial pressure of 3 mmHg.  FINDINGS Left Ventricle: Left ventricular ejection fraction, by estimation, is 55 to 60%. The left ventricle has normal function. The left ventricle has no regional wall motion abnormalities. The left ventricular internal cavity size was normal in size. There is no left ventricular hypertrophy. Left ventricular diastolic parameters are consistent with Grade I diastolic dysfunction (impaired relaxation). Normal left ventricular filling pressure.  Right Ventricle: The right ventricular size is normal. No increase in right ventricular wall thickness. Right ventricular systolic function is normal. There is normal pulmonary artery systolic pressure. The tricuspid regurgitant velocity is 1.87 m/s, and with an assumed right atrial pressure of 3 mmHg, the estimated right ventricular systolic pressure is 17.0 mmHg.  Left Atrium: Left atrial size was normal in size.  Right Atrium: Right atrial size was normal in size.  Pericardium: There is no evidence of pericardial effusion.  Mitral Valve: The mitral valve is normal in structure. Trivial mitral valve regurgitation. No evidence of mitral valve stenosis.  Tricuspid Valve: The tricuspid valve is normal in structure. Tricuspid valve regurgitation is trivial. No evidence of tricuspid stenosis.  Aortic Valve: The aortic valve is tricuspid. Aortic valve regurgitation is trivial. No aortic stenosis is  present.  Pulmonic Valve: The pulmonic valve was normal in structure. Pulmonic valve regurgitation is not visualized. No evidence of pulmonic stenosis.  Aorta: The aortic root is normal in size and structure.  Venous: The inferior vena cava is normal in size with greater than 50% respiratory variability, suggesting right atrial pressure of 3 mmHg.  IAS/Shunts: No atrial level shunt detected by color flow Doppler.   LEFT VENTRICLE PLAX 2D LVIDd:         4.80 cm  Diastology LVIDs:         3.30 cm  LV e' medial:    7.94 cm/s LV PW:         1.00 cm  LV E/e' medial:  7.6 LV IVS:        1.00 cm  LV e' lateral:   9.68 cm/s LVOT diam:     2.20 cm  LV E/e' lateral: 6.2 LV SV:         71 LV SV Index:   31 LVOT Area:  3.80 cm   RIGHT VENTRICLE             IVC RV S prime:     12.50 cm/s  IVC diam: 1.60 cm TAPSE (M-mode): 2.4 cm  LEFT ATRIUM             Index       RIGHT ATRIUM           Index LA diam:        4.30 cm 1.89 cm/m  RA Area:     12.40 cm LA Vol (A2C):   57.0 ml 25.07 ml/m RA Volume:   28.10 ml  12.36 ml/m LA Vol (A4C):   38.4 ml 16.89 ml/m LA Biplane Vol: 47.7 ml 20.98 ml/m AORTIC VALVE LVOT Vmax:   87.40 cm/s LVOT Vmean:  55.900 cm/s LVOT VTI:    0.188 m  AORTA Ao Root diam: 3.40 cm Ao Asc diam:  3.50 cm  MITRAL VALVE               TRICUSPID VALVE MV Area (PHT): 2.99 cm    TR Peak grad:   14.0 mmHg MV Decel Time: 254 msec    TR Vmax:        187.11 cm/s MV E velocity: 60.20 cm/s MV A velocity: 74.40 cm/s  SHUNTS MV E/A ratio:  0.81        Systemic VTI:  0.19 m Systemic Diam: 2.20 cm  Maudine Sos MD Electronically signed by Maudine Sos MD Signature Date/Time: 09/04/2021/11:17:16 PM    Final          ______________________________________________________________________________________________      EKG:        Recent Labs: 01/11/2024: BUN 18; Creatinine, Ser 1.15; Hemoglobin 15.5; Platelets 274; Potassium 4.3; Sodium 135  Recent  Lipid Panel    Component Value Date/Time   CHOL 138 10/21/2022 0829   TRIG 97 10/21/2022 0829   HDL 57 10/21/2022 0829   CHOLHDL 2.4 10/21/2022 0829   CHOLHDL 4.7 09/05/2021 0240   VLDL 30 09/05/2021 0240   LDLCALC 63 10/21/2022 0829   LDLDIRECT 45 10/23/2021 1026            Physical Exam:    VS:  BP (!) 130/100 Comment: Left arm 134/9063  Pulse 63   Ht 6\' 2"  (1.88 m)   Wt 199 lb (90.3 kg) Comment: Per patient due to work gear  SpO2 98%   BMI 25.55 kg/m     Wt Readings from Last 3 Encounters:  05/30/24 199 lb (90.3 kg)  01/13/24 197 lb (89.4 kg)  01/11/24 200 lb (90.7 kg)     GEN:  Well nourished, well developed in no acute distress HEENT: Normal NECK: No JVD; No carotid bruits LYMPHATICS: No lymphadenopathy CARDIAC: RRR, no murmurs, rubs, gallops RESPIRATORY:  Clear to auscultation without rales, wheezing or rhonchi  ABDOMEN: Soft, non-tender, non-distended MUSCULOSKELETAL:  No edema; No deformity  SKIN: Warm and dry NEUROLOGIC:  Alert and oriented x 3 PSYCHIATRIC:  Normal affect   Assessment & Plan Coronary artery disease involving native coronary artery of native heart with angina pectoris (HCC) The patient is stable at this time with no symptoms of angina on aspirin  and atorvastatin .  His stress PET scan is reviewed and showed normal myocardial perfusion.  He will follow-up in 1 year and will continue current medications. Essential hypertension Blood pressure elevated today.  Review of home readings as outlined above.  His home blood pressures and blood pressures at his gym  have been consistently within normal range.  Will continue to monitor.  He keeps a close eye on this and will reach out if his readings are running greater than 135/85 on a consistent basis. Mixed hyperlipidemia Treated with high intensity statin drug.  Last LDL 52 mg/dL.  He has his annual physical coming up next month with his PCP where labs will be checked.  His last ALT was 29.  In 2023  with review of his labs I see that he had a mildly elevated ALT.  Again, labs surveillance in the near future noted.      Medication Adjustments/Labs and Tests Ordered: Current medicines are reviewed at length with the patient today.  Concerns regarding medicines are outlined above.  No orders of the defined types were placed in this encounter.  No orders of the defined types were placed in this encounter.   Patient Instructions  Follow-Up: At [facility], you and your health needs are our priority.  As part of our continuing mission to provide you with exceptional heart care, our providers are all part of one team.  This team includes your primary Cardiologist (physician) and Advanced Practice Providers or APPs (Physician Assistants and Nurse Practitioners) who all work together to provide you with the care you need, when you need it.  Your next appointment:   1 year(s)  Provider:   Arnoldo Lapping, MD       Signed, Arnoldo Lapping, MD  05/30/2024 1:18 PM    [facility]

## 2024-05-30 NOTE — Assessment & Plan Note (Signed)
 Treated with high intensity statin drug.  Last LDL 52 mg/dL.  He has his annual physical coming up next month with his PCP where labs will be checked.  His last ALT was 29.  In 2023 with review of his labs I see that he had a mildly elevated ALT.  Again, labs surveillance in the near future noted.

## 2024-05-30 NOTE — Patient Instructions (Signed)
 Follow-Up: At [facility], you and your health needs are our priority.  As part of our continuing mission to provide you with exceptional heart care, our providers are all part of one team.  This team includes your primary Cardiologist (physician) and Advanced Practice Providers or APPs (Physician Assistants and Nurse Practitioners) who all work together to provide you with the care you need, when you need it.  Your next appointment:   1 year(s)  Provider:   Arnoldo Lapping, MD

## 2024-06-28 ENCOUNTER — Telehealth: Payer: Self-pay | Admitting: Cardiovascular Disease

## 2024-06-28 MED ORDER — ATORVASTATIN CALCIUM 80 MG PO TABS: 80.0000 mg | ORAL_TABLET | Freq: Every day | ORAL | 3 refills | Status: AC

## 2024-06-28 NOTE — Telephone Encounter (Signed)
*  STAT* If patient is at the pharmacy, call can be transferred to refill team.   1. Which medications need to be refilled? (please list name of each medication and dose if known) atorvastatin  (LIPITOR ) 80 MG tablet   2. Which pharmacy/location (including street and city if local pharmacy) is medication to be sent to? [id number] - [place], [place] - [address] Phone: [phone]  Fax: [phone]   3. Do they need a 30 day or 90 day supply? 30

## 2024-06-28 NOTE — Telephone Encounter (Signed)
 Pt's medication was sent to pt's pharmacy as requested. Confirmation received.
